# Patient Record
Sex: Female | Born: 1955 | Race: White | Hispanic: No | Marital: Married | Smoking: Former smoker
Health system: Southern US, Community
[De-identification: ages and names within clinical notes are randomized; demographics above are authoritative.]

## PROBLEM LIST (undated history)

## (undated) DIAGNOSIS — R87619 Unspecified abnormal cytological findings in specimens from cervix uteri: Secondary | ICD-10-CM

## (undated) DIAGNOSIS — B009 Herpesviral infection, unspecified: Secondary | ICD-10-CM

## (undated) DIAGNOSIS — Z780 Asymptomatic menopausal state: Secondary | ICD-10-CM

## (undated) DIAGNOSIS — IMO0002 Reserved for concepts with insufficient information to code with codable children: Secondary | ICD-10-CM

## (undated) HISTORY — DX: Reserved for concepts with insufficient information to code with codable children: IMO0002

## (undated) HISTORY — DX: Herpesviral infection, unspecified: B00.9

## (undated) HISTORY — DX: Asymptomatic menopausal state: Z78.0

## (undated) HISTORY — PX: BASAL CELL CARCINOMA EXCISION: SHX1214

## (undated) HISTORY — DX: Unspecified abnormal cytological findings in specimens from cervix uteri: R87.619

---
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
## 1987-12-19 HISTORY — PX: TUBAL LIGATION: SHX77

## 1999-03-09 ENCOUNTER — Other Ambulatory Visit: Admission: RE | Admit: 1999-03-09 | Discharge: 1999-03-09 | Payer: Self-pay | Admitting: *Deleted

## 1999-03-21 ENCOUNTER — Ambulatory Visit (HOSPITAL_COMMUNITY): Admission: RE | Admit: 1999-03-21 | Discharge: 1999-03-21 | Payer: Self-pay | Admitting: *Deleted

## 1999-03-25 ENCOUNTER — Ambulatory Visit (HOSPITAL_COMMUNITY): Admission: RE | Admit: 1999-03-25 | Discharge: 1999-03-25 | Payer: Self-pay | Admitting: *Deleted

## 2000-02-20 ENCOUNTER — Other Ambulatory Visit: Admission: RE | Admit: 2000-02-20 | Discharge: 2000-02-20 | Payer: Self-pay | Admitting: *Deleted

## 2002-06-16 ENCOUNTER — Other Ambulatory Visit: Admission: RE | Admit: 2002-06-16 | Discharge: 2002-06-16 | Payer: Self-pay | Admitting: Family Medicine

## 2002-06-16 ENCOUNTER — Encounter: Admission: RE | Admit: 2002-06-16 | Discharge: 2002-06-16 | Payer: Self-pay | Admitting: Geriatric Medicine

## 2002-06-16 ENCOUNTER — Encounter: Payer: Self-pay | Admitting: Family Medicine

## 2002-06-16 ENCOUNTER — Encounter: Admission: RE | Admit: 2002-06-16 | Discharge: 2002-06-16 | Payer: Self-pay | Admitting: Family Medicine

## 2002-12-01 ENCOUNTER — Other Ambulatory Visit: Admission: RE | Admit: 2002-12-01 | Discharge: 2002-12-01 | Payer: Self-pay | Admitting: Gynecology

## 2003-05-22 ENCOUNTER — Other Ambulatory Visit: Admission: RE | Admit: 2003-05-22 | Discharge: 2003-05-22 | Payer: Self-pay | Admitting: Gynecology

## 2004-12-18 HISTORY — PX: LUMBAR FUSION: SHX111

## 2005-06-16 ENCOUNTER — Ambulatory Visit (HOSPITAL_COMMUNITY): Admission: RE | Admit: 2005-06-16 | Discharge: 2005-06-17 | Payer: Self-pay | Admitting: Neurosurgery

## 2009-06-07 ENCOUNTER — Other Ambulatory Visit: Admission: RE | Admit: 2009-06-07 | Discharge: 2009-06-07 | Payer: Self-pay | Admitting: Family Medicine

## 2010-07-06 ENCOUNTER — Other Ambulatory Visit: Admission: RE | Admit: 2010-07-06 | Discharge: 2010-07-06 | Payer: Self-pay | Admitting: Family Medicine

## 2010-07-11 ENCOUNTER — Encounter: Admission: RE | Admit: 2010-07-11 | Discharge: 2010-07-11 | Payer: Self-pay | Admitting: Family Medicine

## 2011-01-08 ENCOUNTER — Encounter: Payer: Self-pay | Admitting: Chiropractic Medicine

## 2011-05-05 NOTE — Op Note (Signed)
Sonia Adams, Sonia Adams               ACCOUNT NO.:  [id number]   MEDICAL RECORD NO.:  [id number]          PATIENT TYPE:  OIB   LOCATION:  2899                         FACILITY:  [facility]   PHYSICIAN:  Payton Doughty, M.D.      DATE OF BIRTH:  05/09/1956   DATE OF PROCEDURE:  06/16/2005  DATE OF DISCHARGE:                                 OPERATIVE REPORT   PREOPERATIVE DIAGNOSIS:  Herniated disk at C6-7 on the right.   POSTOPERATIVE DIAGNOSIS:  Herniated disk at C6-7 on the right.   OPERATION:  C6-7 anterior cervical decompression and fusion with reflex  hybrid plate.   SURGEON:  Payton Doughty, M.D.   SERVICE:  Neurosurgery.   ANESTHESIA:  General endotracheal.   PREPARATION:  ____________ alcohol wipe.   COMPLICATIONS:  None.   NURSE ASSISTANT:  Covington.   DOCTOR ASSISTANT:  Jenkins.   DESCRIPTION OF PROCEDURE:  A 55 year old right handed white lady with a  right C7 radiculopathy secondary to herniated disk at C6-7. Taking to the  operating room after general anesthesia, intubated, and placed supine on the  operating table with the neck extended in the halter head traction.  Following shave, prepped and draped in the usual sterile fashion. The skin  was incised in the midline in the medial border of the sternocleidomastoid  on the left side. The platysma was identified and elevated, divided and  undermined. The sternocleidomastoid was identified and medial dissection  revealed the carotid artery, retracted and elevated to the left.  The  trachea and esophagus retracted laterally to the right sparing the recurrent  laryngeal nerves. Markers placed, intraoperative x-ray obtained to confirm  correct level and C6-7 disk removed under gross observation. The operating  microscope was then brought in, we used microdissection technique to remove  the posterior annulus, remove the herniated disk that extended down in the  right C7 neuroforamen. Decompressed the foramen bilaterally and  removed the  posterior longitudinal ligament. Following complete decompression, the 8 mm  bone graft was fashioned with patellar allograft and tapped into placed. A  14 mm reflex hybrid plate with 12 mm screws was then placed in C6 and C7.  Intraoperative x-rays show good placement of bone graft, plate and screws.  The wound was irrigated and hemostasis assured. The platysma and  subcutaneous tissue was reapproximated with 3-0 Vicryl in interrupted  fashion, skin was closed with 4-0 Vicryl in running subcuticular fashion.  Benzoin and Steri-Strips were placed ______________ op site and the patient  returned to the recovery room after being placed in an Aspen collar.       MWR/MEDQ  D:  06/16/2005  T:  06/16/2005  Job:  [id number]

## 2011-05-05 NOTE — H&P (Signed)
Sonia Adams               ACCOUNT NO.:  [id number]   MEDICAL RECORD NO.:  [id number]          PATIENT TYPE:  OIB   LOCATION:  NA                           FACILITY:  [facility]   PHYSICIAN:  Payton Doughty, M.D.      DATE OF BIRTH:  1956/11/24   DATE OF ADMISSION:  06/16/2005  DATE OF DISCHARGE:                                HISTORY & PHYSICAL   ADMISSION DIAGNOSIS:  C6-7 herniated disk.   BODY OF TEXT:  A very nice 55 year old right-handed white lady who in March  developed pain in her neck and shoulder.  It went away, but burning  sensation down the index and middle finger of the right hand.  She visited  with Dr. Jeral Fruit and was found to have a weak triceps.  Came and visited with  me, asked me to operate on her, and she is admitted now for an anterior  decompression and fusion.   MEDICAL HISTORY:  Unremarkable.   SOCIAL HISTORY:  Tubal ligation and tonsillectomy.   ALLERGIES:  None.   MEDICATIONS:  Potassium, Megace and B complex vitamins.   SOCIAL HISTORY:  Does not smoke, social drinker.  Is a Consulting civil engineer at [employer].   REVIEW OF SYSTEMS:  Remarkable for neck pain, arm pain and shoulder pain.   PHYSICAL EXAMINATION:  HEENT:  Within normal limits.  NECK:  She has reasonable range of motion in her neck.  CHEST:  Clear.  CARDIAC:  Regular rate and rhythm.  ABDOMEN:  Nontender with no hepatosplenomegaly.  EXTREMITIES:  Without clubbing or cyanosis.  GENITOURINARY:  Deferred.  VASCULAR:  Peripheral pulses are good.  NEUROLOGIC:  She is awake, alert and oriented.  Her cranial nerves are  intact.  Motor exam demonstrates 5/5 strength throughout the upper  extremities save for the right triceps, which is 1/5 with some atrophy.  Sensory exam:  There is a deficit in her right C7 distribution.  Deep tendon  reflexes are absent about the right triceps, otherwise intact.   MR demonstrates a large herniated disk at C6-7 with compression of the right  C7 root.   IMPRESSION:  Right C7  radiculopathy related to a herniated disk.   PLAN:  Anterior decompression and fusion at C6-7.  The risks and benefits of  this approach have been discussed, and she wishes to proceed.       MWR/MEDQ  D:  06/16/2005  T:  06/16/2005  Job:  [id number]

## 2012-06-12 ENCOUNTER — Other Ambulatory Visit: Payer: Self-pay | Admitting: Family Medicine

## 2012-06-12 ENCOUNTER — Other Ambulatory Visit (HOSPITAL_COMMUNITY)
Admission: RE | Admit: 2012-06-12 | Discharge: 2012-06-12 | Disposition: A | Discharge status: Home / Self Care | Payer: BC Managed Care – PPO | Source: Ambulatory Visit | Attending: Family Medicine | Admitting: Family Medicine

## 2012-06-12 DIAGNOSIS — Z124 Encounter for screening for malignant neoplasm of cervix: Secondary | ICD-10-CM | POA: Insufficient documentation

## 2012-07-31 ENCOUNTER — Encounter: Payer: Self-pay | Admitting: Internal Medicine

## 2012-07-31 ENCOUNTER — Ambulatory Visit (INDEPENDENT_AMBULATORY_CARE_PROVIDER_SITE_OTHER): Payer: BC Managed Care – PPO | Admitting: Internal Medicine

## 2012-07-31 VITALS — BP 100/70 | HR 76 | Temp 99.3°F | Resp 16 | Ht 65.75 in | Wt 164.0 lb

## 2012-07-31 DIAGNOSIS — Z87898 Personal history of other specified conditions: Secondary | ICD-10-CM

## 2012-07-31 DIAGNOSIS — IMO0002 Reserved for concepts with insufficient information to code with codable children: Secondary | ICD-10-CM

## 2012-07-31 DIAGNOSIS — Z8619 Personal history of other infectious and parasitic diseases: Secondary | ICD-10-CM

## 2012-07-31 DIAGNOSIS — N951 Menopausal and female climacteric states: Secondary | ICD-10-CM

## 2012-07-31 DIAGNOSIS — J309 Allergic rhinitis, unspecified: Secondary | ICD-10-CM

## 2012-07-31 DIAGNOSIS — E785 Hyperlipidemia, unspecified: Secondary | ICD-10-CM

## 2012-07-31 DIAGNOSIS — Z8742 Personal history of other diseases of the female genital tract: Secondary | ICD-10-CM

## 2012-08-01 ENCOUNTER — Encounter: Payer: Self-pay | Admitting: Internal Medicine

## 2012-08-01 DIAGNOSIS — N951 Menopausal and female climacteric states: Secondary | ICD-10-CM | POA: Insufficient documentation

## 2012-08-01 DIAGNOSIS — R87619 Unspecified abnormal cytological findings in specimens from cervix uteri: Secondary | ICD-10-CM | POA: Insufficient documentation

## 2012-08-01 DIAGNOSIS — E785 Hyperlipidemia, unspecified: Secondary | ICD-10-CM | POA: Insufficient documentation

## 2012-08-01 DIAGNOSIS — IMO0002 Reserved for concepts with insufficient information to code with codable children: Secondary | ICD-10-CM | POA: Insufficient documentation

## 2012-08-01 DIAGNOSIS — J309 Allergic rhinitis, unspecified: Secondary | ICD-10-CM | POA: Insufficient documentation

## 2012-08-01 DIAGNOSIS — Z8619 Personal history of other infectious and parasitic diseases: Secondary | ICD-10-CM | POA: Insufficient documentation

## 2012-08-01 NOTE — Progress Notes (Signed)
Subjective:    Patient ID: Sonia Adams, female    DOB: May 08, 1956, 56 y.o.   MRN: [id number]  HPI Sonia Adams is here as a new pt . For opinion regarding HT.  Primary care Dr. Zachery Dauer who she wishes to keep.  PMH of allergic rhinitis, remote abnormal pap in 1980's S/P cryosurgery, minimal hyperlipidemia, and remote genital herpes in1979.   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  She report she cannot sleep due to hot flushes and will have them frequently in the morning as well.  Also has vaginal dryness that causes painful intercourse .  She has used multiple OTC lubricants/moisturizers but they did not help.   Her LMP was 06/2010.  Her biologic half sister had breast cancer, no personal or Fh of DVT, PE other gyn cancers.  Father had MI age 72 and a stroke.  Her half sister also had MI and CVA.  Pt does not smoke and quite in 2008  She is due for her mammogram.   She brings copies of labs from [facility] and her [facility] was 122.3  Total cholesterol 220  HDL 57  LDL 148  Chemistries normal    Allergies  Allergen Reactions  . Levaquin (Levofloxacin In D5w) Itching and Other (See Comments)    shakiness   Past Medical History  Diagnosis Date  . Abnormal pap   . HSV-2 infection   . Menopause    Past Surgical History  Procedure Date  . Tonsillectomy 1962  . Tubal ligation 1989  . Lumbar fusion 2006   History   Social History  . Marital Status: Married    Spouse Name: N/A    Number of Children: N/A  . Years of Education: N/A   Occupational History  . Not on file.   Social History Main Topics  . Smoking status: Former Smoker -- 1.0 packs/day for 30 years    Types: Cigarettes    Quit date: 06/01/2007  . Smokeless tobacco: Never Used  . Alcohol Use: Yes     once a month  . Drug Use: No  . Sexually Active: Yes -- Female partner(s)   Other Topics Concern  . Not on file   Social History Narrative  . No narrative on file   Family History    Problem Relation Age of Onset  . Cancer Sister     breast  . Diabetes Mother   . Stroke Father   . Heart disease Father   . Alzheimer's disease Mother    Patient Active Problem List  Diagnosis  . Allergic rhinitis  . Dyspareunia  . Symptoms, such as flushing, sleeplessness, headache, lack of concentration, associated with the menopause  . Hyperlipidemia  . History of herpes genitalis  . History of abnormal Pap smear   Current Outpatient Prescriptions on File Prior to Visit  Medication Sig Dispense Refill  . calcium-vitamin D (OSCAL WITH D) 500-200 MG-UNIT per tablet Take 1 tablet by mouth daily.            Review of Systems See HPI    Objective:   Physical Exam  Physical Exam  Nursing note and vitals reviewed.  Constitutional: She is oriented to person, place, and time. She appears well-developed and well-nourished.  HENT:  Head: Normocephalic and atraumatic.  Cardiovascular: Normal rate and regular rhythm. Exam reveals no gallop and no friction rub.  No murmur heard.  Pulmonary/Chest: Breath sounds normal. She has no wheezes. She has  no rales.  Neurological: She is alert and oriented to person, place, and time.  Skin: Skin is warm and dry.  Psychiatric: She has a normal mood and affect. Her behavior is normal.  Ext no edema           Assessment & Plan:  Symptomatic menopause:  Long counseling regarding risk/benefit of HT including breast cancer, MI, CVA, DVT risk.  See signed risk/benefit sheet.  She is less than 10 years from LMP.  Pt desires to try HT for a short course and I am OK with this for 1-2 years.  Will need mammogram first.  Mild hyperlipidemia  monitered with primary care MD  Allergic rhinitis  FH of breast CA in biologic half sister.  Counseled to make sure she has mammogram every year.

## 2012-08-05 ENCOUNTER — Telehealth: Payer: Self-pay | Admitting: Internal Medicine

## 2012-08-05 NOTE — Telephone Encounter (Signed)
Spoke with pt.  Her mammogram at [facility] shows no significant abnormality.  I counseled pt that I will initiate HT but she must take the BRCA test with FH of breast ca in her half sister.   Will have Lora call pt to discuss  Mervyn Gay call pt about BRCA testing and have her come in for BRCA testing

## 2012-08-06 ENCOUNTER — Telehealth: Payer: Self-pay | Admitting: Internal Medicine

## 2012-08-06 MED ORDER — ESTRADIOL 0.05 MG/24HR TD PTTW: MEDICATED_PATCH | TRANSDERMAL | Status: DC

## 2012-08-06 MED ORDER — PROGESTERONE MICRONIZED 100 MG PO CAPS: 100.0000 mg | ORAL_CAPSULE | Freq: Every day | ORAL | Status: DC

## 2012-08-06 NOTE — Telephone Encounter (Signed)
LM on cell phone that due to her family history she is encouraged to have the [facility] drawn.  Told pt that she can call office for a nurse visit @ her convenience.  Also mailed pt a coupon for [employer].

## 2012-08-06 NOTE — Telephone Encounter (Signed)
Sonia Adams  Let pt know I have a minivelle coupon for her.

## 2012-08-07 ENCOUNTER — Telehealth: Payer: Self-pay | Admitting: Internal Medicine

## 2012-08-07 NOTE — Telephone Encounter (Signed)
Pt was calling to say she had a genetic lab test already.. It test her for [facility] AND [facility] (may have spelled those wrong).. Pt does not want to go thru the expense of getting this test again.Marland Kitchen She will have her results in the next three weeks... She states she can have them fax over to Dr. Constance Goltz... Please call pt at [phone]... Thanks   The message is still on the voicemail, if one needs to hear it.. Ad

## 2012-08-07 NOTE — Telephone Encounter (Signed)
LM on voice mail to clarify of whether the genetic testing was [facility].  She stated that she had had the test done and will fax the results over in about 3 weeks when she receives the results.

## 2012-08-12 ENCOUNTER — Telehealth: Payer: Self-pay | Admitting: Internal Medicine

## 2012-08-12 NOTE — Telephone Encounter (Signed)
Pt called and states she recd the coupon for the minivelle, but when she went to the pharmacy they states she was prescribe vi velle dot.Marland KitchenMarland Kitchen

## 2012-08-12 NOTE — Telephone Encounter (Signed)
i thought they would honor this???

## 2012-08-13 MED ORDER — ESTRADIOL 0.05 MG/24HR TD PTTW: MEDICATED_PATCH | TRANSDERMAL | Status: DC

## 2012-08-16 ENCOUNTER — Encounter: Payer: Self-pay | Admitting: *Deleted

## 2012-08-20 ENCOUNTER — Encounter: Payer: Self-pay | Admitting: *Deleted

## 2012-08-30 ENCOUNTER — Encounter: Payer: Self-pay | Admitting: *Deleted

## 2012-08-30 ENCOUNTER — Encounter: Payer: Self-pay | Admitting: Internal Medicine

## 2012-09-03 ENCOUNTER — Encounter: Payer: Self-pay | Admitting: *Deleted

## 2012-09-18 ENCOUNTER — Encounter: Payer: Self-pay | Admitting: *Deleted

## 2012-09-23 ENCOUNTER — Encounter: Payer: Self-pay | Admitting: *Deleted

## 2012-10-01 ENCOUNTER — Telehealth: Payer: Self-pay | Admitting: *Deleted

## 2012-10-01 NOTE — Telephone Encounter (Signed)
Sonia Adams  Call pt and let her know that I have not received BRCA results.  Did she get the results?  Route back to me with her response

## 2012-10-01 NOTE — Telephone Encounter (Signed)
Left message to call with results of [facility]ACA testing awaiting return call

## 2012-10-01 NOTE — Telephone Encounter (Signed)
Called pt regarding results of [facility]ACA testing left message awaiting return call

## 2012-10-04 ENCOUNTER — Encounter: Payer: Self-pay | Admitting: *Deleted

## 2012-10-28 ENCOUNTER — Ambulatory Visit (INDEPENDENT_AMBULATORY_CARE_PROVIDER_SITE_OTHER): Payer: BC Managed Care – PPO | Admitting: Internal Medicine

## 2012-10-28 ENCOUNTER — Encounter: Payer: Self-pay | Admitting: Internal Medicine

## 2012-10-28 ENCOUNTER — Other Ambulatory Visit: Payer: Self-pay | Admitting: Internal Medicine

## 2012-10-28 VITALS — BP 112/70 | HR 64 | Temp 98.2°F | Resp 18 | Wt 165.0 lb

## 2012-10-28 DIAGNOSIS — N951 Menopausal and female climacteric states: Secondary | ICD-10-CM

## 2012-10-28 DIAGNOSIS — Z23 Encounter for immunization: Secondary | ICD-10-CM

## 2012-10-28 DIAGNOSIS — IMO0002 Reserved for concepts with insufficient information to code with codable children: Secondary | ICD-10-CM

## 2012-10-28 DIAGNOSIS — Z87898 Personal history of other specified conditions: Secondary | ICD-10-CM

## 2012-10-28 DIAGNOSIS — Z8742 Personal history of other diseases of the female genital tract: Secondary | ICD-10-CM

## 2012-10-28 MED ORDER — PROGESTERONE MICRONIZED 100 MG PO CAPS: 100.0000 mg | ORAL_CAPSULE | Freq: Every day | ORAL | Status: DC

## 2012-10-28 MED ORDER — ESTRADIOL 0.05 MG/24HR TD PTTW: MEDICATED_PATCH | TRANSDERMAL | Status: DC

## 2012-10-28 NOTE — Patient Instructions (Addendum)
See me as needed  Flu shot today

## 2012-10-28 NOTE — Progress Notes (Signed)
Subjective:    Patient ID: Sonia Adams, female    DOB: 1956-04-28, 56 y.o.   MRN: [id number]  HPI Sonia Adams is here for follow up  States her hot flushes and dyspareunia is markedly improved.  She still uses KY jelly occasionally.    She brings copies of her BRCA from website [facility] which reports no cancer mutations according to their analysis.        Allergies  Allergen Reactions  . Levaquin (Levofloxacin In D5w) Itching and Other (See Comments)    shakiness   Past Medical History  Diagnosis Date  . Abnormal pap   . HSV-2 infection   . Menopause    Past Surgical History  Procedure Date  . Tonsillectomy 1962  . Tubal ligation 1989  . Lumbar fusion 2006   History   Social History  . Marital Status: Married    Spouse Name: N/A    Number of Children: N/A  . Years of Education: N/A   Occupational History  . Not on file.   Social History Main Topics  . Smoking status: Former Smoker -- 1.0 packs/day for 30 years    Types: Cigarettes    Quit date: 06/01/2007  . Smokeless tobacco: Never Used  . Alcohol Use: Yes     Comment: once a month  . Drug Use: No  . Sexually Active: Yes -- Female partner(s)   Other Topics Concern  . Not on file   Social History Narrative  . No narrative on file   Family History  Problem Relation Age of Onset  . Cancer Sister     breast  . Diabetes Mother   . Stroke Father   . Heart disease Father   . Alzheimer's disease Mother    Patient Active Problem List  Diagnosis  . Allergic rhinitis  . Dyspareunia  . Symptoms, such as flushing, sleeplessness, headache, lack of concentration, associated with the menopause  . Hyperlipidemia  . History of herpes genitalis  . History of abnormal Pap smear   Current Outpatient Prescriptions on File Prior to Visit  Medication Sig Dispense Refill  . calcium-vitamin D (OSCAL WITH D) 500-200 MG-UNIT per tablet Take 1 tablet by mouth daily.      Marland Kitchen estradiol (MINIVELLE) 0.05 MG/24HR Apply to  skin.  Change twice a week  8 patch  2  . Multiple Vitamin (MULTIVITAMIN) tablet Take 1 tablet by mouth daily.      . progesterone (PROMETRIUM) 100 MG capsule Take 1 capsule (100 mg total) by mouth daily.  30 capsule  2       Review of Systems See HPI    Objective:   Physical Exam Physical Exam  Nursing note and vitals reviewed. Bp  My exam good Constitutional: She is oriented to person, place, and time. She appears well-developed and well-nourished.  HENT:  Head: Normocephalic and atraumatic.  Cardiovascular: Normal rate and regular rhythm. Exam reveals no gallop and no friction rub.  No murmur heard.  Pulmonary/Chest: Breath sounds normal. She has no wheezes. She has no rales.  Neurological: She is alert and oriented to person, place, and time.  Skin: Skin is warm and dry.  Psychiatric: She has a normal mood and affect. Her behavior is normal.  Ext no edema            Assessment & Plan:  Menopausal hot flushes  Improved .  Counseled again if any LE edema or vaginal spotting to call office Refill ordered  Dyspareunia  Improved.  Luvena samples given  Hyperlipidemia  mild

## 2012-10-28 NOTE — Addendum Note (Signed)
Addended by: Mathews Robinsons on: 10/28/2012 10:46 AM   Modules accepted: Orders

## 2012-11-04 ENCOUNTER — Other Ambulatory Visit: Payer: Self-pay | Admitting: *Deleted

## 2012-11-08 ENCOUNTER — Encounter: Payer: Self-pay | Admitting: *Deleted

## 2012-11-11 ENCOUNTER — Other Ambulatory Visit: Payer: Self-pay | Admitting: *Deleted

## 2012-11-13 ENCOUNTER — Other Ambulatory Visit: Payer: Self-pay | Admitting: *Deleted

## 2012-11-13 NOTE — Telephone Encounter (Signed)
We have sent this in still getting request I called rx in today

## 2013-01-06 ENCOUNTER — Encounter: Payer: Self-pay | Admitting: Internal Medicine

## 2013-01-06 ENCOUNTER — Ambulatory Visit (INDEPENDENT_AMBULATORY_CARE_PROVIDER_SITE_OTHER): Payer: BC Managed Care – PPO | Admitting: Internal Medicine

## 2013-01-06 VITALS — BP 95/65 | HR 62 | Temp 97.3°F | Resp 20 | Wt 166.0 lb

## 2013-01-06 DIAGNOSIS — J45909 Unspecified asthma, uncomplicated: Secondary | ICD-10-CM

## 2013-01-06 DIAGNOSIS — R059 Cough, unspecified: Secondary | ICD-10-CM

## 2013-01-06 DIAGNOSIS — R05 Cough: Secondary | ICD-10-CM

## 2013-01-06 DIAGNOSIS — J9801 Acute bronchospasm: Secondary | ICD-10-CM

## 2013-01-06 MED ORDER — CEFTRIAXONE SODIUM 1 G IJ SOLR
1.0000 g | INTRAMUSCULAR | Status: DC
  Administered 2013-01-06: 1 g via INTRAMUSCULAR

## 2013-01-06 MED ORDER — ALBUTEROL SULFATE HFA 108 (90 BASE) MCG/ACT IN AERS: INHALATION_SPRAY | RESPIRATORY_TRACT | Status: DC

## 2013-01-06 MED ORDER — AZITHROMYCIN 250 MG PO TABS: ORAL_TABLET | ORAL | Status: DC

## 2013-01-06 MED ORDER — METHYLPREDNISOLONE ACETATE 80 MG/ML IJ SUSP
120.0000 mg | Freq: Once | INTRAMUSCULAR | Status: AC
  Administered 2013-01-06: 120 mg via INTRAMUSCULAR

## 2013-01-06 NOTE — Progress Notes (Signed)
Subjective:    Patient ID: Sonia Adams, female    DOB: September 09, 1956, 57 y.o.   MRN: [id number]  HPI  Sonia Adams is here for acute visit.  She has  Been dealing with URI symptoms on and off since last of December.  No fever no chest pain.  Cough occasionally productive of clear sputum.  Feels tight in chest  She is a former smoker and tells me she quit 5 years ago  Allergies  Allergen Reactions  . Levaquin (Levofloxacin In D5w) Itching and Other (See Comments)    shakiness   Past Medical History  Diagnosis Date  . Abnormal pap   . HSV-2 infection   . Menopause    Past Surgical History  Procedure Date  . Tonsillectomy 1962  . Tubal ligation 1989  . Lumbar fusion 2006   History   Social History  . Marital Status: Married    Spouse Name: N/A    Number of Children: N/A  . Years of Education: N/A   Occupational History  . Not on file.   Social History Main Topics  . Smoking status: Former Smoker -- 1.0 packs/day for 30 years    Types: Cigarettes    Quit date: 06/01/2007  . Smokeless tobacco: Never Used  . Alcohol Use: Yes     Comment: once a month  . Drug Use: No  . Sexually Active: Yes -- Female partner(s)   Other Topics Concern  . Not on file   Social History Narrative  . No narrative on file   Family History  Problem Relation Age of Onset  . Cancer Sister     breast  . Diabetes Mother   . Stroke Father   . Heart disease Father   . Alzheimer's disease Mother    Patient Active Problem List  Diagnosis  . Allergic rhinitis  . Dyspareunia  . Symptoms, such as flushing, sleeplessness, headache, lack of concentration, associated with the menopause  . Hyperlipidemia  . History of herpes genitalis  . History of abnormal Pap smear   Current Outpatient Prescriptions on File Prior to Visit  Medication Sig Dispense Refill  . calcium-vitamin D (OSCAL WITH D) 500-200 MG-UNIT per tablet Take 1 tablet by mouth daily.      Marland Kitchen estradiol (MINIVELLE) 0.05 MG/24HR  Apply to skin.  Change twice a week  8 patch  6  . Multiple Vitamin (MULTIVITAMIN) tablet Take 1 tablet by mouth daily.      . progesterone (PROMETRIUM) 100 MG capsule Take 1 capsule (100 mg total) by mouth daily.  30 capsule  5      Review of Systems See HPI    Objective:   Physical Exam  Physical Exam  Nursing note and vitals reviewed.   Peak flow 260 Constitutional: She is oriented to person, place, and time. She appears well-developed and well-nourished. She is cooperative.  HENT:  Head: Normocephalic and atraumatic.  Nose: Mucosal edema present.  Eyes: Conjunctivae and EOM are normal. Pupils are equal, round, and reactive to light.  Neck: Neck supple.  Cardiovascular: Regular rhythm, normal heart sounds, intact distal pulses and normal pulses. Exam reveals no gallop and no friction rub.  No murmur heard.  Pulmonary/Chest: She has bilateral end exp wheezing. She has rhonchi. She has no rales.  Neurological: She is alert and oriented to person, place, and time.  Skin: Skin is warm and dry. No abrasion, no bruising, no ecchymosis and no rash noted. No cyanosis. Nails show no clubbing.  Psychiatric: She has a normal mood and affect. Her speech is normal and behavior is normal.           Assessment & Plan:  Bronchospasm  Will give depomedrol  120 mg Im in office  Albuterol MDI  2 puffs tid  Bronchitis  Rocephin  1 gm today with Zpak RX  Cough  She does not want RX meds  Ok to try Delsym OTC  She is to follow up with me Wednesday

## 2013-01-06 NOTE — Patient Instructions (Addendum)
See me in 48 hours 

## 2013-01-08 ENCOUNTER — Encounter: Payer: Self-pay | Admitting: Internal Medicine

## 2013-01-08 ENCOUNTER — Ambulatory Visit (INDEPENDENT_AMBULATORY_CARE_PROVIDER_SITE_OTHER): Payer: BC Managed Care – PPO | Admitting: Internal Medicine

## 2013-01-08 VITALS — BP 98/55 | HR 64 | Temp 98.3°F | Resp 18 | Wt 169.0 lb

## 2013-01-08 DIAGNOSIS — R059 Cough, unspecified: Secondary | ICD-10-CM

## 2013-01-08 DIAGNOSIS — R05 Cough: Secondary | ICD-10-CM

## 2013-01-08 DIAGNOSIS — J45909 Unspecified asthma, uncomplicated: Secondary | ICD-10-CM

## 2013-01-08 DIAGNOSIS — J9801 Acute bronchospasm: Secondary | ICD-10-CM

## 2013-01-08 NOTE — Progress Notes (Signed)
Subjective:    Patient ID: PAYTAN RECINE, female    DOB: 01/18/1956, 57 y.o.   MRN: [id number]  HPI Malea is here for follow up.  She is feeling much better.  No night time symptoms.  Cough less and bringing up clear mucous.  Still on Zpak .  Using albuterol tid not requiring more use.  Chest feels less tight   Allergies  Allergen Reactions  . Levaquin (Levofloxacin In D5w) Itching and Other (See Comments)    shakiness   Past Medical History  Diagnosis Date  . Abnormal pap   . HSV-2 infection   . Menopause    Past Surgical History  Procedure Date  . Tonsillectomy 1962  . Tubal ligation 1989  . Lumbar fusion 2006   History   Social History  . Marital Status: Married    Spouse Name: N/A    Number of Children: N/A  . Years of Education: N/A   Occupational History  . Not on file.   Social History Main Topics  . Smoking status: Former Smoker -- 1.0 packs/day for 30 years    Types: Cigarettes    Quit date: 06/01/2007  . Smokeless tobacco: Never Used  . Alcohol Use: Yes     Comment: once a month  . Drug Use: No  . Sexually Active: Yes -- Female partner(s)   Other Topics Concern  . Not on file   Social History Narrative  . No narrative on file   Family History  Problem Relation Age of Onset  . Cancer Sister     breast  . Diabetes Mother   . Stroke Father   . Heart disease Father   . Alzheimer's disease Mother    Patient Active Problem List  Diagnosis  . Allergic rhinitis  . Dyspareunia  . Symptoms, such as flushing, sleeplessness, headache, lack of concentration, associated with the menopause  . Hyperlipidemia  . History of herpes genitalis  . History of abnormal Pap smear  . Asthmatic bronchitis   Current Outpatient Prescriptions on File Prior to Visit  Medication Sig Dispense Refill  . albuterol (PROVENTIL HFA;VENTOLIN HFA) 108 (90 BASE) MCG/ACT inhaler 2 inhalations 3 times a day  1 Inhaler  0  . calcium-vitamin D (OSCAL WITH D) 500-200  MG-UNIT per tablet Take 1 tablet by mouth daily.      Marland Kitchen estradiol (MINIVELLE) 0.05 MG/24HR Apply to skin.  Change twice a week  8 patch  6  . Multiple Vitamin (MULTIVITAMIN) tablet Take 1 tablet by mouth daily.      . progesterone (PROMETRIUM) 100 MG capsule Take 1 capsule (100 mg total) by mouth daily.  30 capsule  5  . azithromycin (ZITHROMAX) 250 MG tablet Take as directed  6 tablet  0       Review of Systems    see HPI Objective:   Physical Exam Physical Exam  Nursing note and vitals reviewed.   Peak flow today 500 Constitutional: She is oriented to person, place, and time. She appears well-developed and well-nourished. She is cooperative.  HENT:  Head: Normocephalic and atraumatic.  Nose: Mucosal edema present.  Eyes: Conjunctivae and EOM are normal. Pupils are equal, round, and reactive to light.  Neck: Neck supple.  Cardiovascular: Regular rhythm, normal heart sounds, intact distal pulses and normal pulses. Exam reveals no gallop and no friction rub.  No murmur heard.  Pulmonary/Chest: She has no wheezes. She has rhonchi. She has no rales.  Moving air much better  Neurological:  She is alert and oriented to person, place, and time.  Skin: Skin is warm and dry. No abrasion, no bruising, no ecchymosis and no rash noted. No cyanosis. Nails show no clubbing.  Psychiatric: She has a normal mood and affect. Her speech is normal and behavior is normal.       Assessment & Plan:  Asthmatic bronchitis  Improving  Continue  Albuterol 3 more days then can stop.  Continue antibiotic  Cough improving

## 2013-01-08 NOTE — Patient Instructions (Addendum)
See me as needed 

## 2013-04-17 ENCOUNTER — Telehealth: Payer: Self-pay | Admitting: *Deleted

## 2013-04-17 ENCOUNTER — Ambulatory Visit: Payer: BC Managed Care – PPO | Admitting: Internal Medicine

## 2013-04-17 ENCOUNTER — Ambulatory Visit (INDEPENDENT_AMBULATORY_CARE_PROVIDER_SITE_OTHER): Payer: BC Managed Care – PPO | Admitting: Internal Medicine

## 2013-04-17 ENCOUNTER — Ambulatory Visit (HOSPITAL_BASED_OUTPATIENT_CLINIC_OR_DEPARTMENT_OTHER)
Admission: RE | Admit: 2013-04-17 | Discharge: 2013-04-17 | Disposition: A | Discharge status: Home / Self Care | Payer: BC Managed Care – PPO | Source: Ambulatory Visit | Attending: Internal Medicine | Admitting: Internal Medicine

## 2013-04-17 ENCOUNTER — Encounter: Payer: Self-pay | Admitting: Internal Medicine

## 2013-04-17 VITALS — BP 94/52 | HR 75 | Temp 97.5°F | Resp 18 | Wt 164.0 lb

## 2013-04-17 DIAGNOSIS — R059 Cough, unspecified: Secondary | ICD-10-CM

## 2013-04-17 DIAGNOSIS — R05 Cough: Secondary | ICD-10-CM

## 2013-04-17 DIAGNOSIS — R509 Fever, unspecified: Secondary | ICD-10-CM

## 2013-04-17 DIAGNOSIS — J329 Chronic sinusitis, unspecified: Secondary | ICD-10-CM

## 2013-04-17 DIAGNOSIS — J3489 Other specified disorders of nose and nasal sinuses: Secondary | ICD-10-CM | POA: Insufficient documentation

## 2013-04-17 MED ORDER — CEFTRIAXONE SODIUM 1 G IJ SOLR
1.0000 g | Freq: Once | INTRAMUSCULAR | Status: AC
  Administered 2013-04-17: 1 g via INTRAMUSCULAR

## 2013-04-17 MED ORDER — AMOXICILLIN-POT CLAVULANATE 500-125 MG PO TABS: ORAL_TABLET | ORAL | Status: DC

## 2013-04-17 MED ORDER — HYDROCOD POLST-CHLORPHEN POLST 10-8 MG/5ML PO LQCR: ORAL | Status: DC

## 2013-04-17 NOTE — Patient Instructions (Addendum)
See me in 7-10 days  

## 2013-04-17 NOTE — Progress Notes (Signed)
Subjective:    Patient ID: Sonia Adams, female    DOB: 01-30-1956, 57 y.o.   MRN: [id number]  HPI  Sonia Adams is here for acute visit.   Sonia Adams had fever to 101.6    2 weeks ago and now has cough productive of white mucous and yellow nasal discharge.  Both maxillary cheeks feel full and hurt when Sonia Adams chews  Fever is gone.   No SOB no chest pain  Allergies  Allergen Reactions  . Levaquin (Levofloxacin In D5w) Itching and Other (See Comments)    shakiness   Past Medical History  Diagnosis Date  . Abnormal pap   . HSV-2 infection   . Menopause    Past Surgical History  Procedure Laterality Date  . Tonsillectomy  1962  . Tubal ligation  1989  . Lumbar fusion  2006   History   Social History  . Marital Status: Married    Spouse Name: N/A    Number of Children: N/A  . Years of Education: N/A   Occupational History  . Not on file.   Social History Main Topics  . Smoking status: Former Smoker -- 1.00 packs/day for 30 years    Types: Cigarettes    Quit date: 06/01/2007  . Smokeless tobacco: Never Used  . Alcohol Use: Yes     Comment: once a month  . Drug Use: No  . Sexually Active: Yes -- Female partner(s)   Other Topics Concern  . Not on file   Social History Narrative  . No narrative on file   Family History  Problem Relation Age of Onset  . Cancer Sister     breast  . Diabetes Mother   . Stroke Father   . Heart disease Father   . Alzheimer's disease Mother    Patient Active Problem List   Diagnosis Date Noted  . Asthmatic bronchitis 01/06/2013  . Allergic rhinitis 08/01/2012  . Dyspareunia 08/01/2012  . Symptoms, such as flushing, sleeplessness, headache, lack of concentration, associated with the menopause 08/01/2012  . Hyperlipidemia 08/01/2012  . 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/01/2012  . History of abnormal Pap smear 08/01/2012   Current Outpatient Prescriptions on File Prior to Visit  Medication Sig Dispense Refill  . calcium-vitamin D (OSCAL  WITH D) 500-200 MG-UNIT per tablet Take 1 tablet by mouth daily.      Marland Kitchen estradiol (MINIVELLE) 0.05 MG/24HR Apply to skin.  Change twice a week  8 patch  6  . Multiple Vitamin (MULTIVITAMIN) tablet Take 1 tablet by mouth daily.      . progesterone (PROMETRIUM) 100 MG capsule Take 1 capsule (100 mg total) by mouth daily.  30 capsule  5  . albuterol (PROVENTIL HFA;VENTOLIN HFA) 108 (90 BASE) MCG/ACT inhaler 2 inhalations 3 times a day  1 Inhaler  0   No current facility-administered medications on file prior to visit.       Review of Systems See HPI    Objective:   Physical Exam Physical Exam  Constitutional: Sonia Adams is oriented to person, place, and time. Sonia Adams appears well-developed and well-nourished. Sonia Adams is cooperative.  HENT:  Head: Normocephalic and atraumatic.  Right Ear: A middle ear effusion is present.  Left Ear: A middle ear effusion is present.  Nose: Mucosal edema present. Right sinus exhibits maxillary sinus tenderness. Left sinus exhibits maxillary sinus tenderness.  Mouth/Throat: Posterior oropharyngeal erythema present.  Serous effusion bilaterally  Eyes: Conjunctivae and EOM are normal. Pupils are equal, round, and reactive to light.  Neck: Neck supple. Carotid bruit is not present. No mass present.  Cardiovascular: Regular rhythm, normal heart sounds, intact distal pulses and normal pulses. Exam reveals no gallop and no friction rub.  No murmur heard.  Pulmonary/Chest: Breath sounds normal. Sonia Adams has no wheezes. Few rhonchii at both bases. Sonia Adams has no rales.  Neurological: Sonia Adams is alert and oriented to person, place, and time.  Skin: Skin is warm and dry. No abrasion, no bruising, no ecchymosis and no rash noted. No cyanosis. Nails show no clubbing.  Psychiatric: Sonia Adams has a normal mood and affect. Her speech is normal and behavior is normal.        Assessment & Plan:  Sinusitis  Will give rocephin 1 gm in office  And Augmentin 500 bid  CXR today shows peribronchial  thickening.    Cough tussionex 1 tsp q12h    See me in 7-10 days

## 2013-04-17 NOTE — Telephone Encounter (Signed)
Pt advised to go to [facility] regarding URI sx pt with appt on 5/01 at 4:15

## 2013-04-30 ENCOUNTER — Ambulatory Visit: Payer: BC Managed Care – PPO | Admitting: Internal Medicine

## 2013-05-15 ENCOUNTER — Other Ambulatory Visit: Payer: Self-pay | Admitting: Internal Medicine

## 2013-05-15 NOTE — Telephone Encounter (Signed)
Pt called needs 2 prescriptions transferred from [facility] to [facility].  Prescriptions Minivelle patch & Progesterone faxed to [facility] pharmacy.  Karin Golden phone number [phone] and fax number [phone].  Pt call back phone number [phone].

## 2013-05-16 NOTE — Telephone Encounter (Signed)
Refill request changed pharmacy in chart per pt request

## 2013-05-19 MED ORDER — ESTRADIOL 0.05 MG/24HR TD PTTW: MEDICATED_PATCH | TRANSDERMAL | Status: DC

## 2013-05-19 MED ORDER — PROGESTERONE MICRONIZED 100 MG PO CAPS: 100.0000 mg | ORAL_CAPSULE | Freq: Every day | ORAL | Status: DC

## 2013-05-19 NOTE — Telephone Encounter (Signed)
[facility] [address]  Call [place] and let her know I gave her enough refills to last through August.  She is due for her mm in August and I will give her more refills when I have her mammogram report back in August

## 2013-06-24 ENCOUNTER — Ambulatory Visit (HOSPITAL_BASED_OUTPATIENT_CLINIC_OR_DEPARTMENT_OTHER)
Admission: RE | Admit: 2013-06-24 | Discharge: 2013-06-24 | Disposition: A | Discharge status: Home / Self Care | Payer: BC Managed Care – PPO | Source: Ambulatory Visit | Attending: Internal Medicine | Admitting: Internal Medicine

## 2013-06-24 ENCOUNTER — Ambulatory Visit (INDEPENDENT_AMBULATORY_CARE_PROVIDER_SITE_OTHER): Payer: BC Managed Care – PPO | Admitting: Internal Medicine

## 2013-06-24 ENCOUNTER — Encounter: Payer: Self-pay | Admitting: Internal Medicine

## 2013-06-24 VITALS — BP 100/60 | HR 61 | Temp 99.5°F | Resp 16 | Ht 67.0 in | Wt 163.0 lb

## 2013-06-24 DIAGNOSIS — R9431 Abnormal electrocardiogram [ECG] [EKG]: Secondary | ICD-10-CM

## 2013-06-24 DIAGNOSIS — Z0000 Encounter for general adult medical examination without abnormal findings: Secondary | ICD-10-CM

## 2013-06-24 DIAGNOSIS — E042 Nontoxic multinodular goiter: Secondary | ICD-10-CM | POA: Insufficient documentation

## 2013-06-24 DIAGNOSIS — E785 Hyperlipidemia, unspecified: Secondary | ICD-10-CM

## 2013-06-24 DIAGNOSIS — Z87891 Personal history of nicotine dependence: Secondary | ICD-10-CM

## 2013-06-24 DIAGNOSIS — N951 Menopausal and female climacteric states: Secondary | ICD-10-CM

## 2013-06-24 DIAGNOSIS — M5020 Other cervical disc displacement, unspecified cervical region: Secondary | ICD-10-CM

## 2013-06-24 DIAGNOSIS — Z803 Family history of malignant neoplasm of breast: Secondary | ICD-10-CM

## 2013-06-24 LAB — POCT URINALYSIS DIPSTICK
Bilirubin, UA: NEGATIVE
Blood, UA: NEGATIVE
Nitrite, UA: NEGATIVE
Protein, UA: NEGATIVE
Spec Grav, UA: 1.01
Urobilinogen, UA: NEGATIVE
pH, UA: 6.5

## 2013-06-24 LAB — COMPREHENSIVE METABOLIC PANEL
AST: 21 U/L (ref 0–37)
CO2: 29 mEq/L (ref 19–32)
Chloride: 103 mEq/L (ref 96–112)
Creat: 0.81 mg/dL (ref 0.50–1.10)
Glucose, Bld: 69 mg/dL — ABNORMAL LOW (ref 70–99)
Sodium: 137 mEq/L (ref 135–145)

## 2013-06-24 LAB — LIPID PANEL
HDL: 68 mg/dL (ref >39)
Total CHOL/HDL Ratio: 3.6 Ratio

## 2013-06-24 LAB — TSH: TSH: 1.039 u[IU]/mL (ref 0.350–4.500)

## 2013-06-24 NOTE — Patient Instructions (Addendum)
Will get thyroid ultrasound  Will refer to cardiology   Labs today

## 2013-06-24 NOTE — Progress Notes (Signed)
Subjective:    Patient ID: Sonia Adams, female    DOB: [birth year], 57 y.o.   MRN: [id number]  HPI Sonia Adams is here for CPE/  REcently returned from a trip to [place] with her sister.    UTD with mm, normal dexa 2011,  And normal pap 2013  Dr. Zachery Dauer.  She tells me she had remote cervical cryosurgery in 1980's Dr. Gaye Alken but has had normal paps for several years.   She also reports she has a history of MNG and was evaluatead with imaging years ago.  See report of 2000.  She had multiple hot nodules consistant with MNG.  She does report some dysphagia but she attributed that to her cervical fusion.  No history of XRT.  No HYPER/HYpothyroid symptoms.   She stopped smoking in 2008.  She has less than  30 pack year history.  Smoked "off and on"  Biologic half Sister with breast cancer .  Pt went ot My [employer] me website and was negative to analysis tested.  We have had long discussion of breast cancer risk in th epast and we agreeed to a short course of HT 1-2 years   No vaginal spotting or LE edema on HT  SEe EKG   Pt completely asymptomatic.  No past chest pain or pressure.  I have no old ekg for comparison  Allergies  Allergen Reactions  . Levaquin (Levofloxacin In D5w) Itching and Other (See Comments)    shakiness   Past Medical History  Diagnosis Date  . Abnormal pap   . HSV-2 infection   . Menopause    Past Surgical History  Procedure Laterality Date  . Tonsillectomy  1962  . Tubal ligation  1989  . Lumbar fusion  2006   History   Social History  . Marital Status: Married    Spouse Name: N/A    Number of Children: N/A  . Years of Education: N/A   Occupational History  . Not on file.   Social History Main Topics  . Smoking status: Former Smoker -- 1.00 packs/day for 30 years    Types: Cigarettes    Quit date: 06/01/2007  . Smokeless tobacco: Never Used  . Alcohol Use: Yes     Comment: once a month  . Drug Use: No  . Sexually Active: Yes -- Female partner(s)    Other Topics Concern  . Not on file   Social History Narrative  . No narrative on file   Family History  Problem Relation Age of Onset  . Cancer Sister     breast  . Diabetes Mother   . Stroke Father   . Heart disease Father   . Alzheimer's disease Mother    Patient Active Problem List   Diagnosis Date Noted  . Nonspecific abnormal electrocardiogram (ECG) (EKG) 06/24/2013  . Multinodular goiter 06/24/2013  . Herniated cervical disc 06/24/2013  . Asthmatic bronchitis 01/06/2013  . Allergic rhinitis 08/01/2012  . Dyspareunia 08/01/2012  . Symptoms, such as flushing, sleeplessness, headache, lack of concentration, associated with the menopause 08/01/2012  . Hyperlipidemia 08/01/2012  . 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/01/2012  . History of abnormal Pap smear 08/01/2012   Current Outpatient Prescriptions on File Prior to Visit  Medication Sig Dispense Refill  . calcium-vitamin D (OSCAL WITH D) 500-200 MG-UNIT per tablet Take 1 tablet by mouth daily.      Marland Kitchen estradiol (MINIVELLE) 0.05 MG/24HR Apply to skin.  Change twice a week  8 patch  2  .  Magnesium 500 MG TABS Take 1 tablet by mouth daily.      . Multiple Vitamin (MULTIVITAMIN) tablet Take 1 tablet by mouth daily.      . progesterone (PROMETRIUM) 100 MG capsule Take 1 capsule (100 mg total) by mouth daily.  30 capsule  2   No current facility-administered medications on file prior to visit.        Review of Systems  All other systems reviewed and are negative.       Objective:   Physical Exam Physical Exam  Nursing note and vitals reviewed.  Constitutional: She is oriented to person, place, and time. She appears well-developed and well-nourished.  HENT:  Head: Normocephalic and atraumatic.  Right Ear: Tympanic membrane and ear canal normal. No drainage. Tympanic membrane is not injected and not erythematous.  Left Ear: Tympanic membrane and ear canal normal. No drainage. Tympanic membrane is not injected and  not erythematous.  Nose: Nose normal. Right sinus exhibits no maxillary sinus tenderness and no frontal sinus tenderness. Left sinus exhibits no maxillary sinus tenderness and no frontal sinus tenderness.  Mouth/Throat: Oropharynx is clear and moist. No oral lesions. No oropharyngeal exudate.  Eyes: Conjunctivae and EOM are normal. Pupils are equal, round, and reactive to light.  Neck: Normal range of motion. Neck supple. No JVD present. Carotid bruit is not present.  She has palpable thyorid mass on Right  Easily mobile.  Cardiovascular: Normal rate, regular rhythm, S1 normal, S2 normal and intact distal pulses. Exam reveals no gallop and no friction rub.  No murmur heard.  Pulses:  Carotid pulses are 2+ on the right side, and 2+ on the left side.  Dorsalis pedis pulses are 2+ on the right side, and 2+ on the left side.  No carotid bruit. No LE edema  Pulmonary/Chest: Breath sounds normal. She has no wheezes. She has no rales. She exhibits no tenderness. Breast no discrete masses no nipple discharge no axillary adenoapathy bilaterally Abdominal: Soft. Bowel sounds are normal. She exhibits no distension and no mass. There is no hepatosplenomegaly. There is no tenderness. There is no CVA tenderness.  Rectal guaiac neg Musculoskeletal: Normal range of motion.  No active synovitis to joints.  Lymphadenopathy:  She has no cervical adenopathy.  She has no axillary adenopathy.  Right: No inguinal and no supraclavicular adenopathy present.  Left: No inguinal and no supraclavicular adenopathy present.  Neurological: She is alert and oriented to person, place, and time. She has normal strength and normal reflexes. She displays no tremor. No cranial nerve deficit or sensory deficit. Coordination and gait normal.  Skin: Skin is warm and dry. No rash noted. No cyanosis. Nails show no clubbing.  Psychiatric: She has a normal mood and affect. Her speech is normal and behavior is normal. Cognition and memory  are normal.           Assessment & Plan:  Health Maintenance:  Info given on Zostavax.  Pap next year.  Colonoscopy due 2017.  See scANNEd sheet  Abnormal EKG  I have no EKG for comparison.  Will err on the side of caution and get cardiology opinion  Thyroid mass/  H/O MNG with hot solid nodules.   Last imaging in 2000.  Will repeat ultrasound with labs today.  Further management based on results  Symptomatic menopause:   Short course of HT.  She has been on HT now 10 months with great relief  Biologic half-sister with breast cancer.  Pt advised to discuss with  genetic counselor need for formal BRCA1 and BRCA2 testing.  website My23 is negative  Hyperlipdemia DASH diet will recechet today  Former smoker:  Discussed recent guideline for scfreening chest CT.  I believes she has approx 20-30 year pack/years.  She declines imaging at this point.

## 2013-06-25 ENCOUNTER — Telehealth: Payer: Self-pay | Admitting: *Deleted

## 2013-06-25 LAB — CBC WITH DIFFERENTIAL/PLATELET
Basophils Absolute: 0.1 10*3/uL (ref 0.0–0.1)
Basophils Relative: 1 % (ref 0–1)
HCT: 42.5 % (ref 36.0–46.0)
MCH: 33.3 pg (ref 26.0–34.0)
MCHC: 35.1 g/dL (ref 30.0–36.0)
Monocytes Absolute: 0.8 10*3/uL (ref 0.1–1.0)
Monocytes Relative: 12 % (ref 3–12)
Neutro Abs: 4 10*3/uL (ref 1.7–7.7)
Platelets: 253 10*3/uL (ref 150–400)
RBC: 4.47 MIL/uL (ref 3.87–5.11)

## 2013-06-25 LAB — VITAMIN D 25 HYDROXY (VIT D DEFICIENCY, FRACTURES): Vit D, 25-Hydroxy: 43 ng/mL (ref 30–89)

## 2013-06-30 ENCOUNTER — Telehealth: Payer: Self-pay | Admitting: *Deleted

## 2013-06-30 NOTE — Telephone Encounter (Signed)
Message copied by Mathews Robinsons on Mon Jun 30, 2013  2:42 PM ------      Message from: Raechel Chute D      Created: Sun Jun 29, 2013  2:48 PM       Karen Kitchens            Call pt and let her know that her cholesterol is very high.  Schedule a 30 min appt with me before school starts for her            Message back with appt date ------

## 2013-06-30 NOTE — Telephone Encounter (Signed)
Message copied by Mathews Robinsons on Mon Jun 30, 2013  9:49 AM ------      Message from: Raechel Chute D      Created: Sun Jun 29, 2013  2:48 PM       Karen Kitchens            Call pt and let her know that her cholesterol is very high.  Schedule a 30 min appt with me before school starts for her            Message back with appt date ------

## 2013-06-30 NOTE — Telephone Encounter (Signed)
Left VM message to return call  

## 2013-06-30 NOTE — Telephone Encounter (Signed)
appt made for 07/08/13 at 815

## 2013-06-30 NOTE — Telephone Encounter (Signed)
Attempted to call pt on home phone LVM messsage to return call

## 2013-07-01 ENCOUNTER — Telehealth: Payer: Self-pay | Admitting: Internal Medicine

## 2013-07-01 NOTE — Telephone Encounter (Signed)
Left message for pt to call office regarding thyroid ultrasound results

## 2013-07-02 ENCOUNTER — Telehealth: Payer: Self-pay | Admitting: Internal Medicine

## 2013-07-02 DIAGNOSIS — E041 Nontoxic single thyroid nodule: Secondary | ICD-10-CM

## 2013-07-02 NOTE — Telephone Encounter (Signed)
Spoke with pt and informed of thyroid ultrasound results.  Radiologist not sure if bilobed single solid lesion or two adjacent lesions.   Will get endocrinology opinion  Pt voices understanding.  Heather   Will refer to Dr. Elvera Lennox endocrinology

## 2013-07-02 NOTE — Telephone Encounter (Signed)
Called patient and left message about her appointment with Dr Elvera Lennox on 07/21/13 at 1:00 pm

## 2013-07-08 ENCOUNTER — Encounter: Payer: Self-pay | Admitting: Internal Medicine

## 2013-07-08 ENCOUNTER — Ambulatory Visit (INDEPENDENT_AMBULATORY_CARE_PROVIDER_SITE_OTHER): Payer: BC Managed Care – PPO | Admitting: Internal Medicine

## 2013-07-08 VITALS — BP 88/60 | HR 61 | Temp 97.3°F | Resp 18 | Wt 162.0 lb

## 2013-07-08 DIAGNOSIS — E042 Nontoxic multinodular goiter: Secondary | ICD-10-CM

## 2013-07-08 DIAGNOSIS — Z860100 Personal history of colon polyps, unspecified: Secondary | ICD-10-CM

## 2013-07-08 DIAGNOSIS — E785 Hyperlipidemia, unspecified: Secondary | ICD-10-CM

## 2013-07-08 NOTE — Patient Instructions (Addendum)
See me in 6 months

## 2013-07-08 NOTE — Progress Notes (Signed)
  Subjective:    Patient ID: Sonia Adams, female    DOB: Dec 03, 1956, 57 y.o.   MRN: [id number]  HPI Sonia Adams is here for follow up  See lipids.  Pt really does not want any RX meds for this now and would like to try a 6 month low fat diet to see if she can improve her profile.  FH father had MI and CVA  She is a former smoker and remained in tobacco free.    Her Framingham risk score done in office is 4.1%  No cardiac symptoms  See BP  She donated plasma yesterday  She has upcominng appt with both cardiology and endocrinology  Las colonoscopy 2011  Small polyp found.  She would like to check with insurance before see GI MD   Review of Systems See HPI    Objective:   Physical Exam  Physical Exam  Nursing note and vitals reviewed.  Constitutional: She is oriented to person, place, and time. She appears well-developed and well-nourished.  HENT:  Head: Normocephalic and atraumatic.  Cardiovascular: Normal rate and regular rhythm. Exam reveals no gallop and no friction rub.  No murmur heard.  Pulmonary/Chest: Breath sounds normal. She has no wheezes. She has no rales.  Neurological: She is alert and oriented to person, place, and time.  Ext:   No edema Skin: Skin is warm and dry.  Psychiatric: She has a normal mood and affect. Her behavior is normal.            Assessment & Plan:  Hyperlipidemia   Given copy of DASH diet.  Will compromise and let pt try to improve diet for 6 months.  REcheck fasting levels in 6 months  Framingham risk 4.1%  Abnormal EKG  She has appt to see Dr. Myrtis Ser soon  MNG   Upcoming appt with endocrinologist  Colon polyp  She will check with insurance .  She is to call when she is ready for GI referral

## 2013-07-10 ENCOUNTER — Encounter: Payer: Self-pay | Admitting: *Deleted

## 2013-07-15 ENCOUNTER — Ambulatory Visit (INDEPENDENT_AMBULATORY_CARE_PROVIDER_SITE_OTHER): Payer: BC Managed Care – PPO | Admitting: Cardiology

## 2013-07-15 ENCOUNTER — Other Ambulatory Visit: Payer: Self-pay

## 2013-07-15 ENCOUNTER — Encounter: Payer: Self-pay | Admitting: Cardiology

## 2013-07-15 ENCOUNTER — Other Ambulatory Visit (INDEPENDENT_AMBULATORY_CARE_PROVIDER_SITE_OTHER): Payer: BC Managed Care – PPO

## 2013-07-15 VITALS — BP 108/64 | HR 68 | Ht 67.0 in | Wt 162.4 lb

## 2013-07-15 DIAGNOSIS — R9431 Abnormal electrocardiogram [ECG] [EKG]: Secondary | ICD-10-CM

## 2013-07-15 DIAGNOSIS — E785 Hyperlipidemia, unspecified: Secondary | ICD-10-CM

## 2013-07-15 NOTE — Patient Instructions (Signed)
**Note De-Identified Sonia Adams Obfuscation** Your physician recommends that you continue on your current medications as directed. Please refer to the Current Medication list given to you today.   Your physician has requested that you have an echocardiogram. Echocardiography is a painless test that uses sound waves to create images of your heart. It provides your doctor with information about the size and shape of your heart and how well your heart's chambers and valves are working. This procedure takes approximately one hour. There are no restrictions for this procedure.  Your physician recommends that you schedule a follow-up appointment in: depends on Echo results.

## 2013-07-15 NOTE — Assessment & Plan Note (Addendum)
The patient's risk score is low enough to allow for dietary approach to her lipids. However she does have significant LDL elevation that is to be followed up by her primary physician.  As part of today's evaluation I have seen the patient is a new patient. I have reviewed all of her old records carefully. I reviewed her prior EKG in today's EKG. I've had a careful lengthy discussion with her about the approach to her evaluation.

## 2013-07-15 NOTE — Assessment & Plan Note (Signed)
The patient does have decreased R wave in V1 and V2. There is a family history of coronary disease. She does not have any significant symptoms. It is probable that the EKG changes are a a normal variant. However we must prove this by obtaining echo data to be sure that wall motion is normal. I've explained this to her. We will proceed with two-dimensional echo. I will be in touch with her with the information. If the echo is completely normal, she will not need any further cardiac workup. In that case she will not need a followup visit. If the echo is abnormal we will proceed with further evaluation.

## 2013-07-15 NOTE — Progress Notes (Signed)
HPI  The patient is seen today as a new patient for cardiac evaluation. She recently had an EKG that was read as normal related to decreased R wave in lead V1 and V2. She is a former smoker but has not smoked in 6 years. There is a family history of coronary disease with her father having an MI at age 57. She does not have diabetes or hypertension. She is active. She has returned to her walking exercise program. She does not have chest pain or shortness of breath.  Allergies  Allergen Reactions  . Levaquin (Levofloxacin In D5w) Itching and Other (See Comments)    shakiness    Current Outpatient Prescriptions  Medication Sig Dispense Refill  . calcium-vitamin D (OSCAL WITH D) 500-200 MG-UNIT per tablet Take 1 tablet by mouth daily.      Marland Kitchen estradiol (MINIVELLE) 0.05 MG/24HR Apply to skin.  Change twice a week  8 patch  2  . fish oil-omega-3 fatty acids 1000 MG capsule Take 2 g by mouth daily.      . Magnesium 500 MG TABS Take 1 tablet by mouth daily.      . Multiple Vitamin (MULTIVITAMIN) tablet Take 1 tablet by mouth daily.      . progesterone (PROMETRIUM) 100 MG capsule Take 1 capsule (100 mg total) by mouth daily.  30 capsule  2   No current facility-administered medications for this visit.    History   Social History  . Marital Status: Married    Spouse Name: N/A    Number of Children: N/A  . Years of Education: N/A   Occupational History  . Not on file.   Social History Main Topics  . Smoking status: Former Smoker -- 1.00 packs/day for 20 years    Types: Cigarettes    Quit date: 06/01/2007  . Smokeless tobacco: Never Used  . Alcohol Use: Yes     Comment: once a month  . Drug Use: No  . Sexually Active: Yes -- Female partner(s)   Other Topics Concern  . Not on file   Social History Narrative  . No narrative on file    Family History  Problem Relation Age of Onset  . Diabetes Mother   . Alzheimer's disease Mother   . Stroke Father   . Heart disease Father      Past Medical History  Diagnosis Date  . Abnormal pap   . HSV-2 infection   . Menopause     Past Surgical History  Procedure Laterality Date  . Tonsillectomy  1962  . Tubal ligation  1989  . Lumbar fusion  2006    Patient Active Problem List   Diagnosis Date Noted  . Personal history of colonic polyps 07/08/2013  . Nonspecific abnormal electrocardiogram (ECG) (EKG) 06/24/2013  . Multinodular goiter 06/24/2013  . Herniated cervical disc 06/24/2013  . Former smoker 06/24/2013  . Family history of breast cancer in first degree relative 06/24/2013  . Abnormal EKG 06/24/2013  . Asthmatic bronchitis 01/06/2013  . Allergic rhinitis 08/01/2012  . Dyspareunia 08/01/2012  . Symptoms, such as flushing, sleeplessness, headache, lack of concentration, associated with the menopause 08/01/2012  . Hyperlipidemia 08/01/2012  . 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/01/2012  . History of abnormal Pap smear 08/01/2012    ROS   Patient denies fever, chills, headache, sweats, rash, change in vision, change in hearing, chest pain, cough, nausea vomiting, urinary symptoms. All other systems are reviewed and are negative.  PHYSICAL EXAM  Patient  is oriented to person time and place. Affect is normal. There is no jugulovenous distention. There are no carotid bruits. Lungs are clear. Respiratory effort is nonlabored. Cardiac exam reveals S1 and S2. There are no clicks or significant murmurs. The abdomen is soft. There are no bruits heard. There is no peripheral edema. There are no musculoskeletal deformities. There are no skin rashes.  Filed Vitals:   07/15/13 1032  BP: 108/64  Pulse: 68  Height: 5\' 7"  (1.702 m)  Weight: 162 lb 6.4 oz (73.664 kg)  SpO2: 98%   I reviewed the EKG in the computer from June 24, 2013. There is RSR'  in V1. There is decreased anterior R wave in V1 and V2. EKG was repeated today to see if there was any variation. It looks the same. Once again there is RSR' in V1. On this  occasion the computer has read incomplete right bundle branch block. There is decreased R wave in V1 and V2 similar to the prior tracing.  ASSESSMENT & PLAN

## 2013-07-16 ENCOUNTER — Other Ambulatory Visit: Payer: Self-pay | Admitting: Internal Medicine

## 2013-07-16 NOTE — Telephone Encounter (Signed)
Refill request

## 2013-07-21 ENCOUNTER — Ambulatory Visit (INDEPENDENT_AMBULATORY_CARE_PROVIDER_SITE_OTHER): Payer: BC Managed Care – PPO | Admitting: Internal Medicine

## 2013-07-21 ENCOUNTER — Encounter: Payer: Self-pay | Admitting: Internal Medicine

## 2013-07-21 VITALS — BP 118/68 | HR 71 | Temp 98.5°F | Resp 12 | Ht 67.0 in | Wt 164.0 lb

## 2013-07-21 DIAGNOSIS — E042 Nontoxic multinodular goiter: Secondary | ICD-10-CM

## 2013-07-21 NOTE — Progress Notes (Signed)
Patient ID: Sonia Adams, female   DOB: August 27, 1956, 57 y.o.   MRN: [id number]   HPI  Sonia Adams is a 57 y.o.-year-old female, referred by her PCP, Dr. Constance Goltz, for evaluation for MNG.  I reviewed patient's chart and previous thyroid evaluations: In 2000, she was found to have a goiter by palpation >> sent for U/S: - Thyroid U/S (03/21/1999): right lobe larger than the left, measuring 5 x 1.4 x 2 cm, containing 2 adjacent nodules of 1.3 x 1.4 x 2.7 cm (half cystic and half solid, with some internal calcifications), and the other one at 0.8 x 0.8 x 0.9 cm (also cystic and solid). Small, 3 x 3 x 4 mm nodule in the left lobe. - Thyroid scan (03/26/1999): multiple focal hot nodules corresponding to nodule seen on ultrasound. None appear cold. Nodules on the right are more hyperactive than those on the left. (she explained that the thyroid scan was performed because the thyroid ultrasound images were "inconclusive"?) - Thyroid U/S (06/24/2013): The thyroid lobes measure 3.9 x 2.2 x 1.8 cm - right, 3.8 x 1.5 x 1.3 cm - left. Again, 2 adjacent nodules in the right lobe, measuring in total dimensions 2.2 x 1.6 x 1.2 cm, appears smaller than previously seen (at 2.7 cm in the largest dimension). Tiny bilateral subcentimeter nodules, with the largest measuring 7 mm. No lymphadenopathy.  I reviewed pt's thyroid tests: Lab Results  Component Value Date   TSH 1.039 06/24/2013    Pt denies feeling nodules in neck, no hoarseness, + dysphagia - ever since her cervical fusion surgery in 2006/no odynophagia, no SOB with lying down.  Pt c/o: - no heat intolerance/cold intolrance - no tremors - no anxiety/depression - no palpitations - no problems with concentration - + fatigue - no hyperdefecation/constipation - no weight loss - + weight gain: 15 lbs in past 10 years (perimenopause)  Pt does not have a FH of thyroid ds. No FH of thyroid cancer. No h/o radiation tx to head or neck.  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  Past Medical History  Diagnosis Date  . Abnormal pap   . HSV-2 infection   . Menopause     Past Surgical History  Procedure Laterality Date  . Tonsillectomy  1962  . Tubal ligation  1989  . Lumbar fusion  2006    History   Social History  . Marital Status: Married    Spouse Name: N/A    Number of Children: 0   Occupational History  . 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 Smoking status: Former Smoker -- 1.00 packs/day for 20 years    Types: Cigarettes    Quit date: 06/01/2007  . Smokeless tobacco: Never Used  . Alcohol Use: Yes     Comment: once a month  . Drug Use: No  . Sexually Active: Yes -- Female partner(s)   Current Outpatient Prescriptions on File Prior to Visit  Medication Sig Dispense Refill  . calcium-vitamin D (OSCAL WITH D) 500-200 MG-UNIT per tablet Take 1 tablet by mouth daily.      . fish oil-omega-3 fatty acids 1000 MG capsule Take 2 g by mouth daily.      . Magnesium 500 MG TABS Take 1 tablet by mouth daily.      Marland Kitchen  MINIVELLE 0.05 MG/24HR APPLY TO SKIN.  CHANGE TWICE A WEEK AS DIRECTED.  8 patch  3  . Multiple Vitamin (MULTIVITAMIN) tablet Take 1 tablet by mouth daily.      . progesterone (PROMETRIUM) 100 MG capsule TAKE 1 CAPSULE BY MOUTH DAILY  30 capsule  2   No current facility-administered medications on file prior to visit.   Allergies  Allergen Reactions  . Levaquin (Levofloxacin In D5w) Itching and Other (See Comments)    shakiness   Family History  Problem Relation Age of Onset  . Diabetes Mother   . Alzheimer's disease Mother   . Stroke Father   . Heart disease Father    ROS: Constitutional: please see history of present illness Eyes: Some blurry vision - needs reading glasses, no xerophthalmia ENT: no sore  throat, no nodules palpated in throat, no dysphagia/odynophagia, no hoarseness Cardiovascular: no CP/SOB/palpitations/leg swelling Respiratory: no cough/SOB Gastrointestinal: no N/V/D/+ C Musculoskeletal: + muscle aches/joint aches Skin: no rashes Neurological: no tremors/numbness/tingling/dizziness Psychiatric: no depression/anxiety  PE: BP 118/68  Pulse 71  Temp(Src) 98.5 F (36.9 C) (Oral)  Resp 12  Ht 5\' 7"  (1.702 m)  Wt 164 lb (74.39 kg)  BMI 25.68 kg/m2  SpO2 98%  LMP 01/01/2013 Wt Readings from Last 3 Encounters:  07/21/13 164 lb (74.39 kg)  07/15/13 162 lb 6.4 oz (73.664 kg)  07/08/13 162 lb (73.483 kg)   Constitutional: normal weight, in NAD Eyes: PERRLA, EOMI, no exophthalmos ENT: moist mucous membranes, + thyromegaly, R>L, no cervical lymphadenopathy Cardiovascular: RRR, No MRG Respiratory: CTA B Gastrointestinal: abdomen soft, NT, ND, BS+ Musculoskeletal: no deformities, strength intact in all 4;  Skin: moist, warm, no rashes Neurological: no tremor with outstretched hands, DTR normal in all 4  ASSESSMENT: 1. MNG - Thyroid U/S (03/21/1999): right lobe larger than the left, measuring 5 x 1.4 x 2 cm, containing 2 adjacent nodules of 1.3 x 1.4 x 2.7 cm (half cystic and half solid, with some internal calcifications), and the other one at 0.8 x 0.8 x 0.9 cm (also cystic and solid). Small, 3 x 3 x 4 mm nodule in the left lobe. - Thyroid scan (03/26/1999): multiple focal hot nodules corresponding to nodule seen on ultrasound. None appear cold. Nodules on the right are more hyperactive than those on the left. (she explained that the thyroid scan was performed because the thyroid ultrasound images were "inconclusive"?) - Thyroid U/S (06/24/2013): The thyroid lobes measure 3.9 x 2.2 x 1.8 cm - right, 3.8 x 1.5 x 1.3 cm - left. Again, 2 adjacent nodules in the right lobe, measuring in total dimensions 2.2 x 1.6 x 1.2 cm, appears smaller than previously seen (at 2.7 cm in the  largest dimension). Tiny bilateral subcentimeter nodules, with the largest measuring 7 mm. No lymphadenopathy.  PLAN: 1. MNG  - I reviewed the images of her thyroid ultrasound along with the patient. The dominant lesion is formed is likely by 2 adjacent nodules close together; comparing the reports from 2000 and 2014, there is no difference in the size of the nodule. I could not review the thyroid ultrasound images from 2000, only the report. I pointed out that the dominant nodule(s) are large, solid-cystic, with calcifications, without internal blood flow, and well delimited from surrounding tissue. Pt does not have a thyroid cancer family history or a personal history of RxTx to head/neck, favoring benignity. I believe her risk of cancer is less than 10-15%. We discussed about the indolent nature of the thyroid  cancer, even if her lesions turn out to be malignant. - I explained that the only way that we can tell exactly if it is cancer or not is by doing a thyroid biopsy (FNA). We discussed about other option to wait for another year and see if the nodule grows, and only intervene at that time. I have low suspicion for interval increase in size, since the nodules did not grow over 14 year period. I think this is a very good sign, but again, cannot exclude malignancy completely. The patient opted to wait a year to see the thyroid nodule will grow. - she should let me know if she develops neck compression symptoms, in that case, we might need to do lobectomy  - I did explain that, while hemithyroidectomy surgery is not a complicated one, it still can have side effects and also she might have a risk of ~24% of becoming hypothyroid - patient decided to wait for another year, and recheck an ultrasound at that time - I'll see her back in a year. If she develops neck compression symptoms, we will meet sooner.  - I advised pt to contact me through my chart  - no labs for today

## 2013-07-21 NOTE — Patient Instructions (Addendum)
Please let me know if you develop problems swallowing or breathing or you develop hoarseness. Please return in 1 year.

## 2013-07-22 ENCOUNTER — Encounter: Payer: Self-pay | Admitting: Cardiology

## 2013-07-22 NOTE — Progress Notes (Signed)
   I saw the patient in the office recently to help with the evaluation of her EKG. Two-dimensional echo was ordered. It is completely normal. The ejection fraction is 55-60%. There are no focal wall motion abnormalities. Diastolic function is normal. Right ventricular function is normal.  The patient's EKG changes are a normal variant with decreased R waves in V1 and V2. She has normal left ventricular function. No further cardiac workup is needed. We will be contacting the patient with all of the results.  Jerral Bonito, MD

## 2013-08-05 NOTE — Telephone Encounter (Signed)
error 

## 2013-08-06 ENCOUNTER — Institutional Professional Consult (permissible substitution): Payer: BC Managed Care – PPO | Admitting: Cardiology

## 2013-10-13 ENCOUNTER — Other Ambulatory Visit: Payer: Self-pay | Admitting: *Deleted

## 2013-10-13 ENCOUNTER — Telehealth: Payer: Self-pay | Admitting: *Deleted

## 2013-10-13 NOTE — Telephone Encounter (Signed)
error 

## 2013-10-13 NOTE — Telephone Encounter (Signed)
Sent coupon for minivelle to pt via mail

## 2013-10-23 ENCOUNTER — Other Ambulatory Visit: Payer: Self-pay

## 2013-11-10 ENCOUNTER — Other Ambulatory Visit: Payer: Self-pay | Admitting: Internal Medicine

## 2013-11-11 NOTE — Telephone Encounter (Signed)
LVM message for pt to return call regarding 2014 MM and refill request

## 2013-12-01 ENCOUNTER — Other Ambulatory Visit: Payer: Self-pay | Admitting: Internal Medicine

## 2014-01-05 ENCOUNTER — Ambulatory Visit (INDEPENDENT_AMBULATORY_CARE_PROVIDER_SITE_OTHER): Payer: BC Managed Care – PPO | Admitting: Internal Medicine

## 2014-01-05 ENCOUNTER — Encounter: Payer: Self-pay | Admitting: Internal Medicine

## 2014-01-05 VITALS — BP 110/68 | HR 69 | Temp 98.2°F | Resp 18 | Wt 169.0 lb

## 2014-01-05 DIAGNOSIS — N951 Menopausal and female climacteric states: Secondary | ICD-10-CM

## 2014-01-05 DIAGNOSIS — E042 Nontoxic multinodular goiter: Secondary | ICD-10-CM

## 2014-01-05 DIAGNOSIS — E785 Hyperlipidemia, unspecified: Secondary | ICD-10-CM

## 2014-01-05 DIAGNOSIS — R9431 Abnormal electrocardiogram [ECG] [EKG]: Secondary | ICD-10-CM

## 2014-01-05 LAB — LIPID PANEL
CHOL/HDL RATIO: 3.3 ratio
CHOLESTEROL: 223 mg/dL — AB (ref 0–200)
HDL: 68 mg/dL (ref >39)
LDL Cholesterol: 143 mg/dL — ABNORMAL HIGH (ref 0–99)
TRIGLYCERIDES: 61 mg/dL (ref <150)
VLDL: 12 mg/dL (ref 0–40)

## 2014-01-05 NOTE — Patient Instructions (Signed)
Get CHillow for night sweats  Can use I-cool for mild flushes  Over the counter.    See me as needed

## 2014-01-05 NOTE — Progress Notes (Signed)
   Subjective:    Patient ID: LANCE GALAS, female    DOB: [birth year], 58 y.o.   MRN: [id number]  HPI Brogan is here for follow up.    See Dr. Ron Parker and Dr. Arman Filter notes.  Pt had ECHO  for poor R wave progression.  ECHO completely normal     MNG  She has been evaluataed by Dr. Cruzita Lederer and offered biopsy for conclusive answer pt has opted to wait and reimage in one year.  She has been trying to follow low fat diet  She reports she has come off her HT and flushing only slightly.     Review of Systems See HPI    Objective:   Physical Exam Physical Exam  Nursing note and vitals reviewed.  Constitutional: She is oriented to person, place, and time. She appears well-developed and well-nourished.  HENT:  Head: Normocephalic and atraumatic.  Cardiovascular: Normal rate and regular rhythm. Exam reveals no gallop and no friction rub.  No murmur heard.  Pulmonary/Chest: Breath sounds normal. She has no wheezes. She has no rales.  Neurological: She is alert and oriented to person, place, and time.  Skin: Skin is warm and dry.  Psychiatric: She has a normal mood and affect. Her behavior is normal.         Assessment & Plan:  Hyperlipidemia  Will check levels today  MNG  Follow up with Dr. Cruzita Lederer in one year  Symptomatic  Menopause:  Off HT now.  Advised cooling pillow and OK for i-cool  OTC    Schedule CPE

## 2014-04-30 ENCOUNTER — Encounter (HOSPITAL_COMMUNITY): Payer: Self-pay | Admitting: Emergency Medicine

## 2014-04-30 ENCOUNTER — Emergency Department (HOSPITAL_COMMUNITY)
Admission: EM | Admit: 2014-04-30 | Discharge: 2014-04-30 | Disposition: A | Discharge status: Home / Self Care | Payer: BC Managed Care – PPO | Source: Home / Self Care | Attending: Family Medicine | Admitting: Family Medicine

## 2014-04-30 DIAGNOSIS — L237 Allergic contact dermatitis due to plants, except food: Secondary | ICD-10-CM

## 2014-04-30 DIAGNOSIS — L255 Unspecified contact dermatitis due to plants, except food: Secondary | ICD-10-CM

## 2014-04-30 MED ORDER — METHYLPREDNISOLONE ACETATE 40 MG/ML IJ SUSP
80.0000 mg | Freq: Once | INTRAMUSCULAR | Status: AC
  Administered 2014-04-30: 80 mg via INTRAMUSCULAR

## 2014-04-30 MED ORDER — TRIAMCINOLONE ACETONIDE 40 MG/ML IJ SUSP
INTRAMUSCULAR | Status: AC
  Filled 2014-04-30: qty 1

## 2014-04-30 MED ORDER — TRIAMCINOLONE ACETONIDE 40 MG/ML IJ SUSP
40.0000 mg | Freq: Once | INTRAMUSCULAR | Status: AC
  Administered 2014-04-30: 40 mg via INTRAMUSCULAR

## 2014-04-30 MED ORDER — METHYLPREDNISOLONE ACETATE 80 MG/ML IJ SUSP
INTRAMUSCULAR | Status: AC
  Filled 2014-04-30: qty 1

## 2014-04-30 MED ORDER — FLUTICASONE PROPIONATE 0.05 % EX CREA: TOPICAL_CREAM | Freq: Two times a day (BID) | CUTANEOUS | Status: DC

## 2014-04-30 NOTE — ED Provider Notes (Signed)
CSN: [id number]     Arrival date & time 04/30/14  1502 History   First MD Initiated Contact with Patient 04/30/14 1542     Chief Complaint  Patient presents with  . Rash   (Consider location/radiation/quality/duration/timing/severity/associated sxs/prior Treatment) Patient is a 58 y.o. female presenting with rash. The history is provided by the patient.  Rash Location:  Shoulder/arm Shoulder/arm rash location:  L forearm and R forearm Quality: blistering   Duration:  3 days Chronicity:  New Context: plant contact   Context comment:  Onset after working in yard planting flowers.over weekend. Relieved by:  None tried Worsened by:  Nothing tried   Past Medical History  Diagnosis Date  . Abnormal pap   . HSV-2 infection   . Menopause    Past Surgical History  Procedure Laterality Date  . Tonsillectomy  1962  . Tubal ligation  1989  . Lumbar fusion  2006   Family History  Problem Relation Age of Onset  . Diabetes Mother   . Alzheimer's disease Mother   . Stroke Father   . Heart disease Father    History  Substance Use Topics  . Smoking status: Former Smoker -- 1.00 packs/day for 20 years    Types: Cigarettes    Quit date: 06/01/2007  . Smokeless tobacco: Never Used  . Alcohol Use: Yes     Comment: once a month   OB History   Grav Para Term Preterm Abortions TAB SAB Ect Mult Living   0 0 0 0 0 0 0 0 0 0      Review of Systems  Constitutional: Negative.   Skin: Positive for rash.    Allergies  Levaquin  Home Medications   Prior to Admission medications   Medication Sig Start Date End Date Taking? Authorizing Provider  albuterol (PROVENTIL HFA;VENTOLIN HFA) 108 (90 BASE) MCG/ACT inhaler Inhale 2 puffs into the lungs 2 (two) times daily as needed for wheezing or shortness of breath.   Yes Historical Provider, MD  calcium-vitamin D (OSCAL WITH D) 500-200 MG-UNIT per tablet Take 1 tablet by mouth daily.    Historical Provider, MD  fish oil-omega-3 fatty acids  1000 MG capsule Take 2 g by mouth daily.    Historical Provider, MD  Magnesium 500 MG TABS Take 1 tablet by mouth daily.    Historical Provider, MD  Multiple Vitamin (MULTIVITAMIN) tablet Take 1 tablet by mouth daily.    Historical Provider, MD   BP 126/78  Pulse 64  Temp(Src) 98 F (36.7 C) (Oral)  Resp 16  SpO2 98%  LMP 01/01/2013 Physical Exam  Nursing note and vitals reviewed. Constitutional: She is oriented to person, place, and time. She appears well-developed and well-nourished.  Neurological: She is alert and oriented to person, place, and time.  Skin: Skin is warm and dry. Rash noted.  Patchy weeping blistering rash to bilat volar wrists, no infection.    ED Course  Procedures (including critical care time) Labs Review Labs Reviewed - No data to display  Imaging Review No results found.   MDM   1. Contact dermatitis due to poison ivy        Billy Fischer, MD 04/30/14 [phone]

## 2014-04-30 NOTE — ED Notes (Addendum)
Had been working yard earlier this week  , ?contact dermatitis bilateral forearm area

## 2014-06-28 NOTE — Progress Notes (Addendum)
Subjective:    Patient ID: Sonia Adams, female    DOB: 06-01-1956, 58 y.o.   MRN: [id number]  HPI Sonia Adams is here for CPE.  Doing well  Had trip to [place] with sister and niece this summer  HM:  She knows she is overdue for MM last done 8/13 ,  Colonoscopy done 2011,  Dexa 7/11 normal, pap 2013 normal    MNG:  Was offered biopsy by endocrine  Pt declined and decided to follow by imaging.  She has known MNG  Hyperlipidemia  Does not want RX meds.  Has been watching diet   Abd EKG  Poor r wave prgression  Evaluated by cardiology .  Had normal ECHO  Colon polyps  See above   Needs repeat next year  Sister breast CA  Pt had online BRCA testing which she reports was negative.     Former tobacco use :  Pt counseled lung cancer screening guideline  35 pack year smoker  Allergies  Allergen Reactions  . Levaquin [Levofloxacin In D5w] Itching and Other (See Comments)    shakiness   Past Medical History  Diagnosis Date  . Abnormal pap   . HSV-2 infection   . Menopause    Past Surgical History  Procedure Laterality Date  . Tonsillectomy  1962  . Tubal ligation  1989  . Lumbar fusion  2006   History   Social History  . Marital Status: Married    Spouse Name: N/A    Number of Children: N/A  . Years of Education: N/A   Occupational History  . Not on file.   Social History Main Topics  . Smoking status: Former Smoker -- 1.00 packs/day for 20 years    Types: Cigarettes    Quit date: 06/01/2007  . Smokeless tobacco: Never Used  . Alcohol Use: Yes     Comment: once a month  . Drug Use: No  . Sexual Activity: Yes    Partners: Male   Other Topics Concern  . Not on file   Social History Narrative  . No narrative on file   Family History  Problem Relation Age of Onset  . Diabetes Mother   . Alzheimer's disease Mother   . Stroke Father   . Heart disease Father    Patient Active Problem List   Diagnosis Date Noted  . Personal history of colonic polyps  07/08/2013  . Nonspecific abnormal electrocardiogram (ECG) (EKG) 06/24/2013  . Multinodular goiter 06/24/2013  . Herniated cervical disc 06/24/2013  . Former smoker 06/24/2013  . Family history of breast cancer in first degree relative 06/24/2013  . Abnormal EKG 06/24/2013  . Asthmatic bronchitis 01/06/2013  . Allergic rhinitis 08/01/2012  . Dyspareunia 08/01/2012  . Symptoms, such as flushing, sleeplessness, headache, lack of concentration, associated with the menopause 08/01/2012  . Hyperlipidemia 08/01/2012  . 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/01/2012  . History of abnormal Pap smear 08/01/2012   Current Outpatient Prescriptions on File Prior to Visit  Medication Sig Dispense Refill  . albuterol (PROVENTIL HFA;VENTOLIN HFA) 108 (90 BASE) MCG/ACT inhaler Inhale 2 puffs into the lungs 2 (two) times daily as needed for wheezing or shortness of breath.      . calcium-vitamin D (OSCAL WITH D) 500-200 MG-UNIT per tablet Take 1 tablet by mouth daily.      . fish oil-omega-3 fatty acids 1000 MG capsule Take 2 g by mouth daily.      . fluticasone (CUTIVATE) 0.05 % cream Apply  topically 2 (two) times daily.  30 g  1  . Magnesium 500 MG TABS Take 1 tablet by mouth daily.      . Multiple Vitamin (MULTIVITAMIN) tablet Take 1 tablet by mouth daily.       No current facility-administered medications on file prior to visit.       Review of Systems  Respiratory: Negative for chest tightness, shortness of breath and wheezing.   Cardiovascular: Negative for chest pain, palpitations and leg swelling.  Gastrointestinal: Negative for abdominal pain.       Objective:   Physical Exam Physical Exam  Vital signs and nursing note reviewed  Constitutional: She is oriented to person, place, and time. She appears well-developed and well-nourished. She is cooperative.  HENT:  Head: Normocephalic and atraumatic.  Right Ear: Tympanic membrane normal.  Left Ear: Tympanic membrane normal.  Nose: Nose  normal.  Mouth/Throat: Oropharynx is clear and moist and mucous membranes are normal. No oropharyngeal exudate or posterior oropharyngeal erythema.  Eyes: Conjunctivae and EOM are normal. Pupils are equal, round, and reactive to light.  Neck: Neck supple. No JVD present. Carotid bruit is not present. No mass and no thyromegaly present.  Cardiovascular: Regular rhythm, normal heart sounds, intact distal pulses and normal pulses.  Exam reveals no gallop and no friction rub.   No murmur heard. Pulses:      Dorsalis pedis pulses are 2+ on the right side, and 2+ on the left side.  Pulmonary/Chest: Breath sounds normal. She has no wheezes. She has no rhonchi. She has no rales. Right breast exhibits no mass, no nipple discharge and no skin change. Left breast exhibits no mass, no nipple discharge and no skin change.  Abdominal: Soft. Bowel sounds are normal. She exhibits no distension and no mass. There is no hepatosplenomegaly. There is no tenderness. There is no CVA tenderness.  Genitourinary: Rectum normal, vagina normal and uterus normal. Rectal exam shows no mass. Guaiac negative stool. No labial fusion. There is no lesion on the right labia. There is no lesion on the left labia. Cervix exhibits no motion tenderness. Right adnexum displays no mass, no tenderness and no fullness. Left adnexum displays no mass, no tenderness and no fullness. No erythema around the vagina.  Musculoskeletal:       No active synovitis to any joint.    Lymphadenopathy:       Right cervical: No superficial cervical adenopathy present.      Left cervical: No superficial cervical adenopathy present.       Right axillary: No pectoral and no lateral adenopathy present.       Left axillary: No pectoral and no lateral adenopathy present.      Right: No inguinal adenopathy present.       Left: No inguinal adenopathy present.  Neurological: She is alert and oriented to person, place, and time. She has normal strength and normal  reflexes. No cranial nerve deficit or sensory deficit. She displays a negative Romberg sign. Coordination and gait normal.  Skin: Skin is warm and dry. No abrasion, no bruising, no ecchymosis and no rash noted. No cyanosis. Nails show no clubbing.  Psychiatric: She has a normal mood and affect. Her speech is normal and behavior is normal.          Assessment & Plan:   HM:  Pap today.   Smoker for 35 year history  Advised lung cancer screeining with  chest Ct.  Pt declines Ct at this point - advised  to call when ready.       Advised to get mm.  Colonoscopy due next year (W/S).  Infor given for Zostavax  Hyperlipidemia  Recheck today  MNG:  See above .  Followed by endocrine  Abnormal EKG   ECho neg.  No furhter chest pain  Sister with breast CA  See above advised yearly mm  Colon polyps  See above  Histoyr of gential herpes  Has not had an outbreak in many years  Perimenopause  Off HT with sister with breast CA,  OTC lubricants for vaginal dryness  See me as needed  Addendum 7/15   THS is normal  I will have Heahter call pt to remind her that she should keep the yearly follow up appoint ment with Dr. Cruzita Lederer        Assessment & Plan:

## 2014-06-29 ENCOUNTER — Ambulatory Visit (INDEPENDENT_AMBULATORY_CARE_PROVIDER_SITE_OTHER): Payer: BC Managed Care – PPO | Admitting: Internal Medicine

## 2014-06-29 ENCOUNTER — Encounter: Payer: Self-pay | Admitting: Internal Medicine

## 2014-06-29 VITALS — BP 106/67 | HR 66 | Resp 16 | Ht 67.0 in | Wt 159.0 lb

## 2014-06-29 DIAGNOSIS — Z0000 Encounter for general adult medical examination without abnormal findings: Secondary | ICD-10-CM

## 2014-06-29 DIAGNOSIS — E785 Hyperlipidemia, unspecified: Secondary | ICD-10-CM

## 2014-06-29 DIAGNOSIS — Z1151 Encounter for screening for human papillomavirus (HPV): Secondary | ICD-10-CM

## 2014-06-29 DIAGNOSIS — Z803 Family history of malignant neoplasm of breast: Secondary | ICD-10-CM

## 2014-06-29 DIAGNOSIS — E042 Nontoxic multinodular goiter: Secondary | ICD-10-CM

## 2014-06-29 DIAGNOSIS — Z124 Encounter for screening for malignant neoplasm of cervix: Secondary | ICD-10-CM

## 2014-06-29 DIAGNOSIS — R9431 Abnormal electrocardiogram [ECG] [EKG]: Secondary | ICD-10-CM

## 2014-06-29 DIAGNOSIS — R87619 Unspecified abnormal cytological findings in specimens from cervix uteri: Secondary | ICD-10-CM

## 2014-06-29 DIAGNOSIS — Z87891 Personal history of nicotine dependence: Secondary | ICD-10-CM

## 2014-06-29 LAB — COMPREHENSIVE METABOLIC PANEL
ALK PHOS: 61 U/L (ref 39–117)
ALT: 15 U/L (ref 0–35)
AST: 23 U/L (ref 0–37)
Albumin: 4.2 g/dL (ref 3.5–5.2)
BILIRUBIN TOTAL: 0.5 mg/dL (ref 0.2–1.2)
BUN: 11 mg/dL (ref 6–23)
CO2: 31 mEq/L (ref 19–32)
CREATININE: 0.64 mg/dL (ref 0.50–1.10)
Calcium: 9.1 mg/dL (ref 8.4–10.5)
Chloride: 102 mEq/L (ref 96–112)
GLUCOSE: 78 mg/dL (ref 70–99)
Potassium: 4.3 mEq/L (ref 3.5–5.3)
SODIUM: 143 meq/L (ref 135–145)
TOTAL PROTEIN: 6.2 g/dL (ref 6.0–8.3)

## 2014-06-29 LAB — CBC WITH DIFFERENTIAL/PLATELET
Basophils Absolute: 0 10*3/uL (ref 0.0–0.1)
Basophils Relative: 0 % (ref 0–1)
EOS ABS: 0.1 10*3/uL (ref 0.0–0.7)
EOS PCT: 1 % (ref 0–5)
HEMATOCRIT: 40.6 % (ref 36.0–46.0)
HEMOGLOBIN: 14.1 g/dL (ref 12.0–15.0)
LYMPHS ABS: 1.7 10*3/uL (ref 0.7–4.0)
LYMPHS PCT: 25 % (ref 12–46)
MCH: 33.3 pg (ref 26.0–34.0)
MCHC: 34.7 g/dL (ref 30.0–36.0)
MCV: 96 fL (ref 78.0–100.0)
MONO ABS: 0.7 10*3/uL (ref 0.1–1.0)
MONOS PCT: 10 % (ref 3–12)
Neutro Abs: 4.2 10*3/uL (ref 1.7–7.7)
Neutrophils Relative %: 64 % (ref 43–77)
PLATELETS: 260 10*3/uL (ref 150–400)
RBC: 4.23 MIL/uL (ref 3.87–5.11)
RDW: 14 % (ref 11.5–15.5)
WBC: 6.6 10*3/uL (ref 4.0–10.5)

## 2014-06-29 LAB — LIPID PANEL
CHOL/HDL RATIO: 3.5 ratio
Cholesterol: 185 mg/dL (ref 0–200)
HDL: 53 mg/dL (ref >39)
LDL CALC: 120 mg/dL — AB (ref 0–99)
TRIGLYCERIDES: 59 mg/dL (ref <150)
VLDL: 12 mg/dL (ref 0–40)

## 2014-06-29 LAB — POCT URINALYSIS DIPSTICK
Bilirubin, UA: NEGATIVE
GLUCOSE UA: NEGATIVE
KETONES UA: NEGATIVE
Leukocytes, UA: NEGATIVE
Nitrite, UA: NEGATIVE
PROTEIN UA: NEGATIVE
SPEC GRAV UA: 1.01
UROBILINOGEN UA: 0.2
pH, UA: 7.5

## 2014-06-29 NOTE — Patient Instructions (Signed)
See me as needed 

## 2014-06-30 LAB — TSH: TSH: 1.347 u[IU]/mL (ref 0.350–4.500)

## 2014-06-30 LAB — CYTOLOGY - PAP

## 2014-06-30 LAB — CULTURE, URINE COMPREHENSIVE
Colony Count: NO GROWTH
Organism ID, Bacteria: NO GROWTH

## 2014-06-30 LAB — VITAMIN D 25 HYDROXY (VIT D DEFICIENCY, FRACTURES): Vit D, 25-Hydroxy: 48 ng/mL (ref 30–89)

## 2014-07-01 ENCOUNTER — Telehealth: Payer: Self-pay | Admitting: Internal Medicine

## 2014-07-01 NOTE — Telephone Encounter (Signed)
Sonia Adams  Call pt and let her know that her thyroid blood test is normal but advise her to make an appt with Dr. Cruzita Lederer her endocrinologist who wanted to see her in a year - she is due to see her now.   Ok to mail labs to her

## 2014-07-01 NOTE — Telephone Encounter (Signed)
Called [facility] and went over lab work.  Gave her Dr Arman Filter #. She will call and make follow up appt.

## 2014-07-24 ENCOUNTER — Encounter: Payer: Self-pay | Admitting: *Deleted

## 2014-12-08 ENCOUNTER — Encounter: Payer: Self-pay | Admitting: Internal Medicine

## 2014-12-08 ENCOUNTER — Ambulatory Visit (INDEPENDENT_AMBULATORY_CARE_PROVIDER_SITE_OTHER): Payer: BC Managed Care – PPO | Admitting: Internal Medicine

## 2014-12-08 VITALS — BP 103/55 | HR 58 | Resp 16 | Ht 67.0 in | Wt 169.0 lb

## 2014-12-08 DIAGNOSIS — R21 Rash and other nonspecific skin eruption: Secondary | ICD-10-CM

## 2014-12-08 NOTE — Patient Instructions (Signed)
See me as needed 

## 2014-12-08 NOTE — Progress Notes (Signed)
Subjective:    Patient ID: Sonia Adams, female    DOB: May 14, 1956, 58 y.o.   MRN: [id number]  HPI Early November  Had red macule on R forearm .  Spread to left forearm and then was scraped on left forearm.  Wound healed well.  2 days ago red flat macule returned on left forearm.   No weeping no itching.  No lypmh nodules felt by pt  Allergies  Allergen Reactions  . Levaquin [Levofloxacin In D5w] Itching and Other (See Comments)    shakiness   Past Medical History  Diagnosis Date  . Abnormal pap   . HSV-2 infection   . Menopause    Past Surgical History  Procedure Laterality Date  . Tonsillectomy  1962  . Tubal ligation  1989  . Lumbar fusion  2006   History   Social History  . Marital Status: Married    Spouse Name: N/A    Number of Children: N/A  . Years of Education: N/A   Occupational History  . Not on file.   Social History Main Topics  . Smoking status: Former Smoker -- 1.00 packs/day for 20 years    Types: Cigarettes    Quit date: 06/01/2007  . Smokeless tobacco: Never Used  . Alcohol Use: Yes     Comment: once a month  . Drug Use: No  . Sexual Activity:    Partners: Male    Birth Control/ Protection: Post-menopausal   Other Topics Concern  . Not on file   Social History Narrative   Family History  Problem Relation Age of Onset  . Diabetes Mother   . Alzheimer's disease Mother   . Stroke Father   . Heart disease Father    Patient Active Problem List   Diagnosis Date Noted  . Personal history of colonic polyps 07/08/2013  . Nonspecific abnormal electrocardiogram (ECG) (EKG) 06/24/2013  . Multinodular goiter 06/24/2013  . Herniated cervical disc 06/24/2013  . Former smoker  29 year  declines screening chest CT  cessation 2008 06/24/2013  . Family history of breast cancer in first degree relative 06/24/2013  . Abnormal EKG 06/24/2013  . Asthmatic bronchitis 01/06/2013  . Allergic rhinitis 08/01/2012  . Dyspareunia 08/01/2012  .  Symptoms, such as flushing, sleeplessness, headache, lack of concentration, associated with the menopause 08/01/2012  . Hyperlipidemia 08/01/2012  . 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/01/2012  . Abnormal Pap smear of cervix 08/01/2012   Current Outpatient Prescriptions on File Prior to Visit  Medication Sig Dispense Refill  . albuterol (PROVENTIL HFA;VENTOLIN HFA) 108 (90 BASE) MCG/ACT inhaler Inhale 2 puffs into the lungs 2 (two) times daily as needed for wheezing or shortness of breath.    . calcium-vitamin D (OSCAL WITH D) 500-200 MG-UNIT per tablet Take 1 tablet by mouth daily.    . fish oil-omega-3 fatty acids 1000 MG capsule Take 2 g by mouth daily.    . Magnesium 500 MG TABS Take 1 tablet by mouth daily.    . Multiple Vitamin (MULTIVITAMIN) tablet Take 1 tablet by mouth daily.    . Potassium 75 MG TABS Take 1 tablet by mouth daily.     No current facility-administered medications on file prior to visit.       Review of Systems See HPI    Objective:   Physical Exam Physical Exam  Nursing note and vitals reviewed.  Constitutional: She is oriented to person, place, and time. She appears well-developed and well-nourished.  HENT:  Head:  Normocephalic and atraumatic.  Cardiovascular: Normal rate and regular rhythm. Exam reveals no gallop and no friction rub.  No murmur heard.  Pulmonary/Chest: Breath sounds normal. She has no wheezes. She has no rales.  Neurological: She is alert and oriented to person, place, and time.  Skin: Skin is warm and dry. Left forearm  Less than 5 mm red macule left forearm   No cellulitis Lymph no epitrochlear no axillary no cervical adenopathy  M/S  No synovitis Psychiatric: She has a normal mood and affect. Her behavior is normal.              Assessment & Plan:  Rash unclear etiology  Pt has seen Dr. Tonia Brooms int he past  She will call to make appt with her

## 2015-10-12 ENCOUNTER — Other Ambulatory Visit: Payer: Self-pay | Admitting: Family Medicine

## 2015-10-12 DIAGNOSIS — R4702 Dysphasia: Secondary | ICD-10-CM

## 2015-10-14 ENCOUNTER — Other Ambulatory Visit: Payer: BC Managed Care – PPO

## 2015-10-26 ENCOUNTER — Ambulatory Visit
Admission: RE | Admit: 2015-10-26 | Discharge: 2015-10-26 | Disposition: A | Discharge status: Home / Self Care | Payer: BC Managed Care – PPO | Source: Ambulatory Visit | Attending: Family Medicine | Admitting: Family Medicine

## 2015-10-26 DIAGNOSIS — R4702 Dysphasia: Secondary | ICD-10-CM

## 2016-03-29 ENCOUNTER — Ambulatory Visit (INDEPENDENT_AMBULATORY_CARE_PROVIDER_SITE_OTHER): Payer: BC Managed Care – PPO | Admitting: Internal Medicine

## 2016-03-29 ENCOUNTER — Encounter: Payer: Self-pay | Admitting: Internal Medicine

## 2016-03-29 VITALS — BP 120/78 | HR 60 | Temp 98.2°F | Resp 12 | Wt 169.0 lb

## 2016-03-29 DIAGNOSIS — R7989 Other specified abnormal findings of blood chemistry: Secondary | ICD-10-CM

## 2016-03-29 DIAGNOSIS — R946 Abnormal results of thyroid function studies: Secondary | ICD-10-CM

## 2016-03-29 DIAGNOSIS — E042 Nontoxic multinodular goiter: Secondary | ICD-10-CM | POA: Diagnosis not present

## 2016-03-29 NOTE — Progress Notes (Addendum)
Patient ID: Sonia Adams, female   DOB: [birth year], 60 y.o.   MRN: [id number]   HPI  Sonia Adams is a 60 y.o.-year-old female, returning for follow-up for MNG. Last visit 07/2013.  Previous thyroid evaluations were reviewed and addended: In 2000, she was found to have a goiter by palpation >> sent for U/S: - Thyroid U/S (03/21/1999): right lobe larger than the left, measuring 5 x 1.4 x 2 cm, containing 2 adjacent nodules of 1.3 x 1.4 x 2.7 cm (half cystic and half solid, with some internal calcifications), and the other one at 0.8 x 0.8 x 0.9 cm (also cystic and solid). Small, 3 x 3 x 4 mm nodule in the left lobe.  - Thyroid scan (03/26/1999): multiple focal hot nodules corresponding to nodule seen on ultrasound. None appear cold. Nodules on the right are more hyperactive than those on the left. (she explained that the thyroid scan was performed because the thyroid ultrasound images were "inconclusive"?)  - Thyroid U/S (06/24/2013): The thyroid lobes measure 3.9 x 2.2 x 1.8 cm - right, 3.8 x 1.5 x 1.3 cm - left. Again, 2 adjacent nodules in the right lobe, measuring in total dimensions 2.2 x 1.6 x 1.2 cm, appears smaller than previously seen (at 2.7 cm in the largest dimension). Tiny bilateral subcentimeter nodules, with the largest measuring 7 mm. No lymphadenopathy.   - Barium swallow (10/26/2015): Mild posterior effacement of the mid cervical esophagus secondary to C6-7 ACDF. No fixed esophageal narrowing or stricture.Mild gastroesophageal reflux.  She was tx'ed for GERD and started H pylori tx, but could not tolerate it.  I reviewed pt's thyroid tests: 06/2015: TSH  Lab Results  Component Value Date   TSH 1.347 06/29/2014   TSH 1.039 06/24/2013    Pt denies feeling nodules in neck, no hoarseness, + dysphagia - ever since her cervical fusion surgery in 2006/no odynophagia, no SOB with lying down.  Pt mentions: - no heat intolerance/cold intolrance - no tremors - no  anxiety/depression - no palpitations - no fatigue - no hyperdefecation/constipation - no weight loss - no weight gain  Pt does not have a FH of thyroid ds. No FH of thyroid cancer. No h/o radiation tx to head or neck.  Since last visit, she started Ergocalciferol, stopped Ca, Mg, K, omega 3 FA.  ROS: Constitutional: please see history of present illness Eyes: Some blurry vision - needs reading glasses, no xerophthalmia ENT: no sore throat, no nodules palpated in throat, + dysphagia/odynophagia, no hoarseness Cardiovascular: no CP/SOB/palpitations/leg swelling Respiratory: no cough/SOB Gastrointestinal: no N/V/D/C Musculoskeletal: no muscle aches/joint aches Skin: no rashes Neurological: no tremors/numbness/tingling/dizziness  I reviewed pt's medications, allergies, PMH, social hx, family hx, and changes were documented in the history of present illness. Otherwise, unchanged from my initial visit note.  Past Medical History  Diagnosis Date  . Abnormal pap   . HSV-2 infection   . Menopause     Past Surgical History  Procedure Laterality Date  . Tonsillectomy  1962  . Tubal ligation  1989  . Lumbar fusion  2006   History   Social History  . Marital Status: Married    Spouse Name: N/A    Number of Children: 0   Occupational History  . 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 Smoking status: Former Smoker -- 1.00 packs/day for 20 years    Types: Cigarettes    Quit date: 06/01/2007  . Smokeless tobacco: Never Used  .  Alcohol Use: Yes     Comment: once a month  . Drug Use: No  . Sexually Active: Yes -- Female partner(s)   Current Outpatient Prescriptions on File Prior to Visit  Medication Sig Dispense Refill  . albuterol (PROVENTIL HFA;VENTOLIN HFA) 108 (90 BASE) MCG/ACT inhaler Inhale 2 puffs into the lungs 2 (two) times daily as needed for wheezing or shortness of breath.    . calcium-vitamin D (OSCAL WITH D) 500-200 MG-UNIT per tablet Take 1  tablet by mouth daily.    . fish oil-omega-3 fatty acids 1000 MG capsule Take 2 g by mouth daily.    . Magnesium 500 MG TABS Take 1 tablet by mouth daily.    . Multiple Vitamin (MULTIVITAMIN) tablet Take 1 tablet by mouth daily.    . Potassium 75 MG TABS Take 1 tablet by mouth daily.     No current facility-administered medications on file prior to visit.   Allergies  Allergen Reactions  . Levaquin [Levofloxacin In D5w] Itching and Other (See Comments)    shakiness   Family History  Problem Relation Age of Onset  . Diabetes Mother   . Alzheimer's disease Mother   . Stroke Father   . Heart disease Father    PE: BP 120/78 mmHg  Pulse 60  Temp(Src) 98.2 F (36.8 C) (Oral)  Resp 12  Wt 169 lb (76.658 kg)  SpO2 97%  LMP 01/01/2013 Body mass index is 26.46 kg/(m^2). Wt Readings from Last 3 Encounters:  03/29/16 169 lb (76.658 kg)  12/08/14 169 lb (76.658 kg)  06/29/14 159 lb (72.122 kg)   Constitutional: normal weight, in NAD Eyes: PERRLA, EOMI, no exophthalmos ENT: moist mucous membranes, +Slight symmetric thyromegaly,  no cervical lymphadenopathy Cardiovascular: RRR, No MRG Respiratory: CTA B Gastrointestinal: abdomen soft, NT, ND, BS+ Musculoskeletal: no deformities, strength intact in all 4;  Skin: moist, warm, no rashes Neurological: no tremor with outstretched hands, DTR normal in all 4  ASSESSMENT: 1. MNG  PLAN: 1. MNG  - I reviewed the images of her last thyroid ultrasound along with the patient. The dominant lesion is formed is likely by 2 adjacent nodules close together; comparing the reports from 2000 and 2014, there is no difference in the size of the nodule. I could not review the thyroid ultrasound images from 2000, only the report. I pointed out that the dominant nodule(s) are large, solid-cystic, with calcifications, without internal blood flow, and well delimited from surrounding tissue. Pt does not have a thyroid cancer family history or a personal history  of RxTx to head/neck, favoring benignity. I am still worried about the microcalcifications seen in the nodules and I discussed this with the patient. I suggested that we obtain another ultrasound now, almost 3 years since the previous, and if the nodules are still present in approximately the same size as before, I would recommend FNA. She is reticent about this, but after I explained the procedure, she is willing to go ahead with it, if needed. If the nodules are significantly smaller, we may be able to just follow them. - We discussed about the indolent nature of the thyroid cancer, however, in this case, thyroidectomy is recommended - She has had TFTs by PCP in 06/2015. We'll need to obtain these labs. She cannot remember details about them. If abnormal, she will need to repeat. If the labs are normal, I will place the order for the thyroid ultrasound. - I'll see her back in 2 years. If she develops neck  compression symptoms, we will meet sooner.  - I advised pt to contact me through my chart   04/13/2016: Received labs from PCP, drawn on 07/08/2015: TSH was suppressed, at 0.022. We will need to repeat the TSH and add free T4 and free T3 along with TSI antibodies before proceeding with further investigation for the thyroid nodule. If she is still thyrotoxic, she will probably need an uptake and scan next.  Component     Latest Ref Rng 04/19/2016  TSH     0.35 - 4.50 uIU/mL 0.70  T4,Free(Direct)     0.60 - 1.60 ng/dL 0.74  Triiodothyronine,Free,Serum     2.3 - 4.2 pg/mL 3.1  TSI     <140 % baseline 119  All normal.  Will go ahead with the thyroid U/S.  CLINICAL DATA: Multinodular goiter  EXAM: THYROID ULTRASOUND  TECHNIQUE: Ultrasound examination of the thyroid gland and adjacent soft tissues was performed.  COMPARISON: 06/24/2013  FINDINGS: Right thyroid lobe: 48 x 17 x 26 mm.  - 8 x 7 x 11 mm hypoechoic nodule without microcalcifications, inferior pole.  - 24 x 14 x 17 mm  solid nodule with central coarse calcifications, mid lobe (previously 22 x 16 x 12). Left thyroid lobe: 36 x 10 x 16 mm.  - 8 x 5 x 6 mm solid nodule, mid lobe (Previously 7 mm).  - Proximal 4 mm cyst. Isthmus Thickness: 3 mm. No nodules visualized. Lymphadenopathy: None visualized.  IMPRESSION: 1. Borderline thyromegaly with slight enlargement of dominant right nodule, which meets consensus criteria for biopsy. Ultrasound-guided fine needle aspiration should be considered, as per the consensus statement: Management of Thyroid Nodules Detected at [place]: Society of Radiologists in [place]. Radiology 2005; [id number].   Electronically Signed By: Lucrezia Europe M.D. On: 05/10/2016 17:06  Will suggest biopsy of the dominant right nodule.

## 2016-03-29 NOTE — Patient Instructions (Signed)
Please send me the results of your most recent thyroid panel.  After this, we need to order a thyroid U/S or an uptake and scan, depending on the results.   Please return in 2 years.

## 2016-04-13 ENCOUNTER — Encounter: Payer: Self-pay | Admitting: Internal Medicine

## 2016-04-13 NOTE — Addendum Note (Signed)
Addended by: Philemon Kingdom on: 04/13/2016 07:22 AM   Modules accepted: Orders

## 2016-04-17 ENCOUNTER — Other Ambulatory Visit: Payer: BC Managed Care – PPO

## 2016-04-19 ENCOUNTER — Other Ambulatory Visit (INDEPENDENT_AMBULATORY_CARE_PROVIDER_SITE_OTHER): Payer: BC Managed Care – PPO

## 2016-04-19 DIAGNOSIS — E042 Nontoxic multinodular goiter: Secondary | ICD-10-CM | POA: Diagnosis not present

## 2016-04-19 DIAGNOSIS — R946 Abnormal results of thyroid function studies: Secondary | ICD-10-CM | POA: Diagnosis not present

## 2016-04-19 DIAGNOSIS — R7989 Other specified abnormal findings of blood chemistry: Secondary | ICD-10-CM

## 2016-04-19 LAB — T3, FREE: T3 FREE: 3.1 pg/mL (ref 2.3–4.2)

## 2016-04-19 LAB — TSH: TSH: 0.7 u[IU]/mL (ref 0.35–4.50)

## 2016-04-19 LAB — T4, FREE: Free T4: 0.74 ng/dL (ref 0.60–1.60)

## 2016-04-24 LAB — THYROID STIMULATING IMMUNOGLOBULIN: TSI: 119 % baseline (ref <140)

## 2016-04-25 ENCOUNTER — Encounter: Payer: Self-pay | Admitting: Internal Medicine

## 2016-05-10 ENCOUNTER — Ambulatory Visit
Admission: RE | Admit: 2016-05-10 | Discharge: 2016-05-10 | Disposition: A | Discharge status: Home / Self Care | Payer: BC Managed Care – PPO | Source: Ambulatory Visit | Attending: Internal Medicine | Admitting: Internal Medicine

## 2017-07-16 ENCOUNTER — Other Ambulatory Visit: Payer: Self-pay | Admitting: Family Medicine

## 2017-07-16 DIAGNOSIS — Z1231 Encounter for screening mammogram for malignant neoplasm of breast: Secondary | ICD-10-CM

## 2017-07-17 ENCOUNTER — Ambulatory Visit
Admission: RE | Admit: 2017-07-17 | Discharge: 2017-07-17 | Disposition: A | Discharge status: Home / Self Care | Payer: BC Managed Care – PPO | Source: Ambulatory Visit | Attending: Family Medicine | Admitting: Family Medicine

## 2017-07-17 ENCOUNTER — Other Ambulatory Visit: Payer: Self-pay | Admitting: Family Medicine

## 2017-07-17 DIAGNOSIS — M1612 Unilateral primary osteoarthritis, left hip: Secondary | ICD-10-CM

## 2017-07-25 ENCOUNTER — Ambulatory Visit: Payer: BC Managed Care – PPO

## 2017-08-07 ENCOUNTER — Other Ambulatory Visit: Payer: Self-pay | Admitting: Family Medicine

## 2017-08-07 DIAGNOSIS — I7090 Unspecified atherosclerosis: Secondary | ICD-10-CM

## 2017-08-10 ENCOUNTER — Other Ambulatory Visit: Payer: BC Managed Care – PPO

## 2017-08-13 ENCOUNTER — Other Ambulatory Visit: Payer: BC Managed Care – PPO

## 2017-09-21 ENCOUNTER — Other Ambulatory Visit: Payer: Self-pay | Admitting: Family Medicine

## 2017-09-21 DIAGNOSIS — M79605 Pain in left leg: Secondary | ICD-10-CM

## 2017-09-21 DIAGNOSIS — M79604 Pain in right leg: Secondary | ICD-10-CM

## 2017-09-24 ENCOUNTER — Ambulatory Visit
Admission: RE | Admit: 2017-09-24 | Discharge: 2017-09-24 | Disposition: A | Discharge status: Home / Self Care | Payer: BC Managed Care – PPO | Source: Ambulatory Visit | Attending: Family Medicine | Admitting: Family Medicine

## 2017-09-24 ENCOUNTER — Inpatient Hospital Stay
Admission: RE | Admit: 2017-09-24 | Discharge: 2017-09-24 | Disposition: A | Discharge status: Home / Self Care | Payer: BC Managed Care – PPO | Source: Ambulatory Visit | Attending: Family Medicine | Admitting: Family Medicine

## 2017-09-24 DIAGNOSIS — M79604 Pain in right leg: Secondary | ICD-10-CM

## 2017-09-24 DIAGNOSIS — M79605 Pain in left leg: Secondary | ICD-10-CM

## 2017-09-28 ENCOUNTER — Ambulatory Visit
Admission: RE | Admit: 2017-09-28 | Discharge: 2017-09-28 | Disposition: A | Discharge status: Home / Self Care | Payer: BC Managed Care – PPO | Source: Ambulatory Visit | Attending: Family Medicine | Admitting: Family Medicine

## 2017-09-28 DIAGNOSIS — I7090 Unspecified atherosclerosis: Secondary | ICD-10-CM

## 2020-03-13 ENCOUNTER — Ambulatory Visit: Payer: Self-pay | Attending: Internal Medicine

## 2020-03-13 DIAGNOSIS — Z23 Encounter for immunization: Secondary | ICD-10-CM

## 2020-03-13 NOTE — Progress Notes (Signed)
   Covid-19 Vaccination Clinic  Name:  Sonia Adams    MRN: [id number] DOB: December 01, 1956  03/13/2020  [name] was observed post Covid-19 immunization for 15 minutes without incident. She was provided with Vaccine Information Sheet and instruction to access the V-Safe system.   [name] was instructed to call 911 with any severe reactions post vaccine: Marland Kitchen Difficulty breathing  . Swelling of face and throat  . A fast heartbeat  . A bad rash all over body  . Dizziness and weakness   Immunizations Administered    Name Date Dose VIS Date Route   Pfizer COVID-19 Vaccine 03/13/2020  3:13 PM 0.3 mL 11/28/2019 Intramuscular   Manufacturer: [employer], [employer]   Lot: [id number]   [place]: [id number]

## 2020-04-07 ENCOUNTER — Ambulatory Visit: Payer: Self-pay | Attending: Internal Medicine

## 2020-04-07 DIAGNOSIS — Z23 Encounter for immunization: Secondary | ICD-10-CM

## 2020-04-07 NOTE — Progress Notes (Signed)
   Covid-19 Vaccination Clinic  Name:  Sonia Adams    MRN: [id number] DOB: 08/14/1956  04/07/2020  Sonia Adams was observed post Covid-19 immunization for 15 minutes without incident. She was provided with Vaccine Information Sheet and instruction to access the V-Safe system.   Sonia Adams was instructed to call 911 with any severe reactions post vaccine: Marland Kitchen Difficulty breathing  . Swelling of face and throat  . A fast heartbeat  . A bad rash all over body  . Dizziness and weakness   Immunizations Administered    Name Date Dose VIS Date Route   Pfizer COVID-19 Vaccine 04/07/2020  8:32 AM 0.3 mL 02/11/2019 Intramuscular   Manufacturer: [employer]   Lot: [id number]   [place]: [id number]

## 2020-12-31 ENCOUNTER — Other Ambulatory Visit: Payer: Self-pay | Admitting: Family Medicine

## 2020-12-31 DIAGNOSIS — E042 Nontoxic multinodular goiter: Secondary | ICD-10-CM

## 2021-02-18 ENCOUNTER — Ambulatory Visit
Admission: RE | Admit: 2021-02-18 | Discharge: 2021-02-18 | Disposition: A | Discharge status: Home / Self Care | Payer: BC Managed Care – PPO | Source: Ambulatory Visit | Attending: Family Medicine | Admitting: Family Medicine

## 2021-02-18 ENCOUNTER — Other Ambulatory Visit: Payer: Self-pay

## 2021-02-18 DIAGNOSIS — E042 Nontoxic multinodular goiter: Secondary | ICD-10-CM

## 2021-04-12 ENCOUNTER — Ambulatory Visit: Payer: BC Managed Care – PPO | Admitting: Internal Medicine

## 2021-04-12 ENCOUNTER — Encounter: Payer: Self-pay | Admitting: Internal Medicine

## 2021-04-12 ENCOUNTER — Other Ambulatory Visit: Payer: Self-pay

## 2021-04-12 VITALS — BP 120/80 | HR 81 | Ht 67.0 in | Wt 149.0 lb

## 2021-04-12 DIAGNOSIS — E042 Nontoxic multinodular goiter: Secondary | ICD-10-CM | POA: Diagnosis not present

## 2021-04-12 NOTE — Patient Instructions (Addendum)
We will check a biopsy of the R thyroid nodule.  Please come back for a follow-up appointment in 1 year.   Thyroid Needle Biopsy  Thyroid needle biopsy is a procedure to remove small samples of tissue or fluid from the thyroid gland. The samples are then examined under a microscope. The thyroid is a gland in the lower front area of the neck. It produces hormones that affect many important body processes, including growth and development, body temperature, and how the body uses food for energy (metabolism). This procedure is often done to help diagnose cancer, infection, or other problems with the thyroid. During this procedure, a thin needle (fine needle) is inserted through the skin and into the thyroid gland. This is less invasive than a procedure in which an incision is made over the thyroid (open thyroid biopsy). Sometimes, an open thyroid biopsy may be done during a different surgery, such as surgery to remove a part or a whole section (lobe) of the thyroid gland (open lobectomy). Tell a health care provider about:  Any allergies you have.  All medicines you are taking, including vitamins, herbs, eye drops, creams, and over-the-counter medicines.  Any problems you or family members have had with anesthetic medicines.  Any blood disorders you have.  Any surgeries you have had.  Any medical conditions you have.  Whether you are pregnant or may be pregnant. What are the risks? Generally, this is a safe procedure. However, problems may occur, including:  Infection.  Bleeding.  Allergic reactions to medicines.  Damage to nerves or blood vessels in the neck. What happens before the procedure? Medicines  Ask your health care provider about: ? Changing or stopping your regular medicines. This is especially important if you are taking diabetes medicines or blood thinners. ? Taking medicines such as aspirin and ibuprofen. These medicines can thin your blood. Do not take these  medicines unless your health care provider tells you to take them. ? Taking over-the-counter medicines, vitamins, herbs, and supplements. General instructions  You may have blood tests.  You may have an ultrasound before or during the needle biopsy. What happens during the procedure?  You will be asked to lie on your back with your head tipped backward to extend your neck. You may be asked to avoid coughing, talking, swallowing, or making sounds during some parts of the procedure.  To lower your risk of infection: ? Your health care team will wash or sanitize their hands. ? The skin over your thyroid will be cleaned with a germ-killing (antiseptic) solution.  A local anesthetic (lidocaine) may be injected into the skin over your thyroid, to numb the area.  An ultrasound may be done to help guide the needle to the desired area of your thyroid.  A fine needle will be inserted into your thyroid. The needle will be used to remove tissue or fluid samples as needed. The samples will be sent to a lab for examination.  The needle will be removed.  Pressure may be applied to your neck to reduce swelling and stop bleeding. The procedure may vary among health care providers and hospitals. What happens after the procedure?  It is up to you to get the results of your procedure. Ask your health care provider, or the department that is doing the procedure, when your results will be ready. Summary  Thyroid needle biopsy is a procedure to remove small samples of tissue or fluid from the thyroid gland.  During this procedure, a thin needle (  fine needle) is inserted through the skin and into the thyroid gland. This is less invasive than a procedure in which an incision is made over the thyroid (open thyroid biopsy).  You will be asked to lie on your back with your head tipped backward to extend your neck. You may be asked to avoid coughing, talking, swallowing, or making sounds during some parts of  the procedure. This information is not intended to replace advice given to you by your health care provider. Make sure you discuss any questions you have with your health care provider. Document Revised: 08/12/2020 Document Reviewed: 08/12/2020 Elsevier Patient Education  [place].

## 2021-04-12 NOTE — Progress Notes (Addendum)
Blood patient ID: Sonia Adams, female   DOB: 08-18-1956, 65 y.o.   MRN: [id number]  This visit occurred during the SARS-CoV-2 public health emergency.  Safety protocols were in place, including screening questions prior to the visit, additional usage of staff PPE, and extensive cleaning of exam room while observing appropriate contact time as indicated for disinfecting solutions.   HPI  Sonia Adams is a 65 y.o.-year-old female, returning for follow-up for MNG. Last visit 5 years ago.  Interim history: Patient has been doing well since last visit, but she does feel discomfort in her neck when swallowing, and occasional cough.  She has to chew her food very well before swallowing to avoid coughing.  Reviewed and addended previous investigation: In 2000, she was found to have a goiter by palpation >> sent for U/S: - Thyroid U/S (03/21/1999): right lobe larger than the left, measuring 5 x 1.4 x 2 cm, containing 2 adjacent nodules of 1.3 x 1.4 x 2.7 cm (half cystic and half solid, with some internal calcifications), and the other one at 0.8 x 0.8 x 0.9 cm (also cystic and solid). Small, 3 x 3 x 4 mm nodule in the left lobe.  - Thyroid scan (03/26/1999): multiple focal hot nodules corresponding to nodule seen on ultrasound. None appear cold. Nodules on the right are more hyperactive than those on the left. (she explained that the thyroid scan was performed because the thyroid ultrasound images were "inconclusive"?)  - Thyroid U/S (06/24/2013): The thyroid lobes measure 3.9 x 2.2 x 1.8 cm - right, 3.8 x 1.5 x 1.3 cm - left. Again, 2 adjacent nodules in the right lobe, measuring in total dimensions 2.2 x 1.6 x 1.2 cm, appears smaller than previously seen (at 2.7 cm in the largest dimension). Tiny bilateral subcentimeter nodules, with the largest measuring 7 mm. No lymphadenopathy.   - Barium swallow (10/26/2015): Mild posterior effacement of the mid cervical esophagus secondary to C6-7 ACDF. No  fixed esophageal narrowing or stricture.Mild gastroesophageal reflux.  - Thyroid U/S (05/10/2016): Right thyroid lobe: 48 x 17 x 26 mm.  - 8 x 7 x 11 mm hypoechoic nodule without microcalcifications, inferior pole.  - 24 x 14 x 17 mm solid nodule with central coarse calcifications, mid lobe (previously 22 x 16 x 12) Left thyroid lobe: 36 x 10 x 16 mm.  - 8 x 5 x 6 mm solid nodule, mid lobe (Previously 7 mm).  - Proximal 4 mm cyst. Isthmus Thickness: 3 mm. No nodules visualized. Lymphadenopathy: None visualized.  IMPRESSION: 1. Borderline thyromegaly with slight enlargement of dominant right nodule, which meets consensus criteria for biopsy. Ultrasound-guided fine needle aspiration should be considered.  I suggested biopsy of the right thyroid nodule.  However, she was lost for follow-up for me afterwards.  A new thyroid ultrasound was obtained by PCP: - Thyroid U/S (02/18/2021): Parenchymal Echotexture: Mildly heterogenous Isthmus: 0.2 cm, previously 0.3 cm Right lobe: 5.3 x 1.8 x 2.8 cm, previously 4.8 x 1.7 x 2.6 cm Left lobe: 3.5 x 1.2 x 1.5 cm, previously 3.6 x 1.0 x 1.6 cm _________________________________________________________  Nodule # 1: Prior biopsy: No Location: Right; Mid Maximum size: 2.2 cm; Other 2 dimensions: 1.3 x 1.6 cm, previously, 2.4 x 1.4 x 1.7 cm Composition: solid/almost completely solid (2) Echogenicity: hypoechoic (2) Echogenic foci: macrocalcifications (1) **Given size (>/= 1.5 cm) and appearance, fine needle aspiration of this moderately suspicious nodule should be considered based on TI-RADS criteria. However, 5 years of stability  implies benignity. _________________________________________________________  Nodule # 2: Prior biopsy: No Location: Right; Inferior Maximum size: 1.0 cm; Other 2 dimensions: 0.8 x 0.8 cm, previously, 1.1 x 0.8 x 0.7 cm Composition: solid/almost completely solid (2) Echogenicity: hypoechoic (2) *Given size (>/= 1  - 1.4 cm) and appearance, a follow-up ultrasound in 1 year should be considered based on TI-RADS criteria. However, this nodule is now stable 5 years which confers benignity.  _________________________________________________________  Nodule # 3: Prior biopsy: No Location: Left; Mid Maximum size: 1.0 cm; Other 2 dimensions: 0.4 x 0.7 cm, previously, 0.8 x 0.5 x 0.6 cm Composition: solid/almost completely solid (2) Echogenicity: hypoechoic (2) *Given size (>/= 1 - 1.4 cm) and appearance, a follow-up ultrasound in 1 year should be considered based on TI-RADS criteria. However, 5 years of stability implies benignity. _________________________________________________________  IMPRESSION: 1. Unchanged dominant right solid thyroid nodule (labeled 1, 2.2 cm, TI-RADS category 5) which technically meets criteria for ultrasound-guided biopsy, however 5 years of stability is suggestive of benignity. Tissue sampling, 1 year ultrasound follow-up, or no further observation would each be reasonable follow-up considerations. 2. Similar appearing right inferior and left mid thyroid nodules which after 5 years of stability are declared benign and do not warrant additional ultrasound follow-up or tissue sampling.  I reviewed her TFTs: 09/2020: normal TSH reportedly Lab Results  Component Value Date   TSH 0.70 04/19/2016   TSH 1.347 06/29/2014   TSH 1.039 06/24/2013   FREET4 0.74 04/19/2016    Pt denies feeling nodules in neck, no hoarseness, + dysphagia - ever since her cervical fusion surgery in 2006/no odynophagia, no SOB with lying down. Discomfort when lying down and also cough.  She has a history of GERD and H. pylori infection.  No FH of thyroid cancer. No h/o radiation tx to head or neck.  No seaweed or kelp. No recent contrast studies. No herbal supplements. No Biotin use. No recent steroids use.   ROS: Constitutional: no weight gain/no weight loss, no fatigue, no subjective  hyperthermia, no subjective hypothermia Eyes: no blurry vision, no xerophthalmia ENT: no sore throat, + see HPI, + seasonal allergies Cardiovascular: no CP/no SOB/no palpitations/no leg swelling Respiratory: no cough/no SOB/no wheezing Gastrointestinal: no N/no V/no D/no C/+ occasional acid reflux Musculoskeletal: no muscle aches/no joint aches Skin: + rash on forearms - on Clobethasol, no hair loss, + splitting nails Neurological: no tremors/no numbness/no tingling/no dizziness  I reviewed pt's medications, allergies, PMH, social hx, family hx, and changes were documented in the history of present illness. Otherwise, unchanged from my initial visit note.  Past Medical History:  Diagnosis Date  . Abnormal pap   . HSV-2 infection   . Menopause     Past Surgical History:  Procedure Laterality Date  . LUMBAR FUSION  2006  . TONSILLECTOMY  1962  . TUBAL LIGATION  1989   History   Social History  . Marital Status: Married    Spouse Name: N/A    Number of Children: 0   Occupational History  . Paralegal   Social History Main Topics  . Smoking status: Former Smoker --1 pack a day for 20 years    Types: Cigarettes    Quit date: 06/01/2007  . Smokeless tobacco: Never Used  . Alcohol Use:  Yes-socially       . Drug Use: No  . Sexually Active: Yes -- Female partner(s)   Current Outpatient Medications on File Prior to Visit  Medication Sig Dispense Refill  . albuterol (PROVENTIL  HFA;VENTOLIN HFA) 108 (90 BASE) MCG/ACT inhaler Inhale 2 puffs into the lungs as needed for wheezing or shortness of breath.     Marland Kitchen azelastine (ASTELIN) 0.1 % nasal spray Place 2 sprays into both nostrils 2 (two) times daily. Use in each nostril as directed    . calcium-vitamin D (OSCAL WITH D) 500-200 MG-UNIT per tablet Take 1 tablet by mouth daily.    . ergocalciferol (VITAMIN D2) 50000 units capsule Take 50,000 Units by mouth once a week.    . fish oil-omega-3 fatty acids 1000 MG capsule Take 2 g by  mouth daily.    Marland Kitchen loratadine (CLARITIN) 10 MG tablet Take 10 mg by mouth daily.    . Magnesium 500 MG TABS Take 1 tablet by mouth daily.    . Multiple Vitamin (MULTIVITAMIN) tablet Take 1 tablet by mouth daily.    . Potassium 75 MG TABS Take 1 tablet by mouth daily.     No current facility-administered medications on file prior to visit.   Allergies  Allergen Reactions  . Levaquin [Levofloxacin In D5w] Itching and Other (See Comments)    shakiness   Family History  Problem Relation Age of Onset  . Diabetes Mother   . Alzheimer's disease Mother   . Stroke Father   . Heart disease Father    PE: BP 120/80 (BP Location: Left Arm, Patient Position: Sitting, Cuff Size: Normal)   Pulse 81   Ht 5\' 7"  (1.702 m)   Wt 149 lb (67.6 kg)   LMP 01/01/2013   SpO2 98%   BMI 23.34 kg/m  Body mass index is 23.34 kg/m. Wt Readings from Last 3 Encounters:  04/12/21 149 lb (67.6 kg)  03/29/16 169 lb (76.7 kg)  12/08/14 169 lb (76.7 kg)   Constitutional: normal weight, in NAD Eyes: PERRLA, EOMI, no exophthalmos ENT: moist mucous membranes, + slight symmetric thyromegaly, no cervical lymphadenopathy Cardiovascular: RRR, No MRG Respiratory: CTA B Gastrointestinal: abdomen soft, NT, ND, BS+ Musculoskeletal: no deformities, strength intact in all 4 Skin: moist, warm, no rash observed on forearms Neurological: no tremor with outstretched hands, DTR normal in all 4  ASSESSMENT: 1. MNG  PLAN: 1. MNG  -I reviewed the report and images of her latest thyroid ultrasound from last month along with the patient.  The largest thyroid nodule is in the right thyroid lobe, likely formed by 2 adjacent nodules.  Compared to the ultrasound from 2014, there is no difference in size of the nodule.  I could not review the thyroid ultrasound images from 2000, only the report.  The dominant nodule is large, solid-cystic, with some internal blood flow and well delimited from surrounding tissue.  It does contain  microcalcifications, which confers a higher risk, though.  She does not have a family history of thyroid cancer or personal history of radiation therapy to head or neck, favoring benignity. The right thyroid nodule is still large, and despite the fact that it is stable in size, and the fact that it was possibly hot on her thyroid scan from 2000, I still suggest biopsy.  If this returns benign, no further investigation is needed, however, she does complain of some neck compression symptoms: Dysphagia and cough when swallowing.  These are fairly bothersome for her. -We discussed that if the nodule turns out to be cancerous, I would suggest total thyroidectomy. However, if the nodule is benign, I would suggest to repeat her barium swallow.  She had one checked in 2016 and this did not  show compression from the thyroid on the esophagus. -She agrees with the plan -Will not repeat a TSH and this was reportedly normal last year by PCP -I will see her back in a year.  Orders Placed This Encounter  Procedures  . [place] FNA BX THYROID 1ST LESION AFIRMA   FNA (04/20/2021):  Clinical History: Right mid 2.2 cm; Other 2 dimensions: 1.3 x 1.6 cm,  previously, 2.4 x 1.4 x 1.7 cm, Solid/almost completely solid,  Hypoechoic, TI-RADS total points: 5  Specimen Submitted: A. THYROID, RMP, FINE NEEDLE ASPIRATION:    FINAL MICROSCOPIC DIAGNOSIS:  - Consistent with benign follicular nodule (Bethesda category II)   SPECIMEN ADEQUACY:  Satisfactory for evaluation   Philemon Kingdom, MD PhD [facility]

## 2021-04-20 ENCOUNTER — Ambulatory Visit
Admission: RE | Admit: 2021-04-20 | Discharge: 2021-04-20 | Disposition: A | Discharge status: Home / Self Care | Payer: BC Managed Care – PPO | Source: Ambulatory Visit | Attending: Internal Medicine | Admitting: Internal Medicine

## 2021-04-20 ENCOUNTER — Other Ambulatory Visit (HOSPITAL_COMMUNITY)
Admission: RE | Admit: 2021-04-20 | Discharge: 2021-04-20 | Disposition: A | Discharge status: Home / Self Care | Payer: BC Managed Care – PPO | Source: Ambulatory Visit | Attending: Internal Medicine | Admitting: Internal Medicine

## 2021-04-20 DIAGNOSIS — D34 Benign neoplasm of thyroid gland: Secondary | ICD-10-CM | POA: Insufficient documentation

## 2021-04-20 DIAGNOSIS — E042 Nontoxic multinodular goiter: Secondary | ICD-10-CM

## 2021-04-22 LAB — CYTOLOGY - NON PAP

## 2021-04-22 NOTE — Addendum Note (Signed)
Addended by: Philemon Kingdom on: 04/22/2021 04:58 PM   Modules accepted: Orders

## 2021-07-11 IMAGING — US US THYROID
1 series · 12 of 25 positions shown · non-contrast
Comparison: 05/10/2016

CLINICAL DATA: Prior ultrasound follow-up.

EXAM:
THYROID ULTRASOUND
TECHNIQUE: Ultrasound examination of the thyroid gland and adjacent soft
tissues was performed.

[Series 1: us thyroid · 0.04mm/px · 12 of 102 slices shown]
[im 5/102]
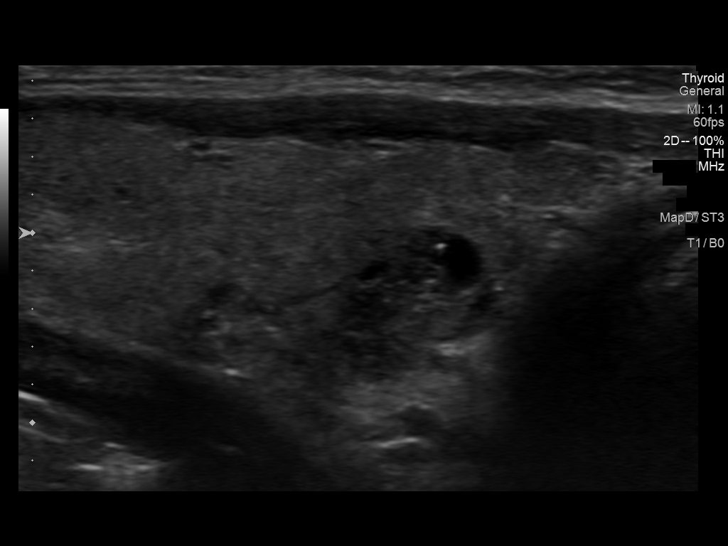
[im 13/102]
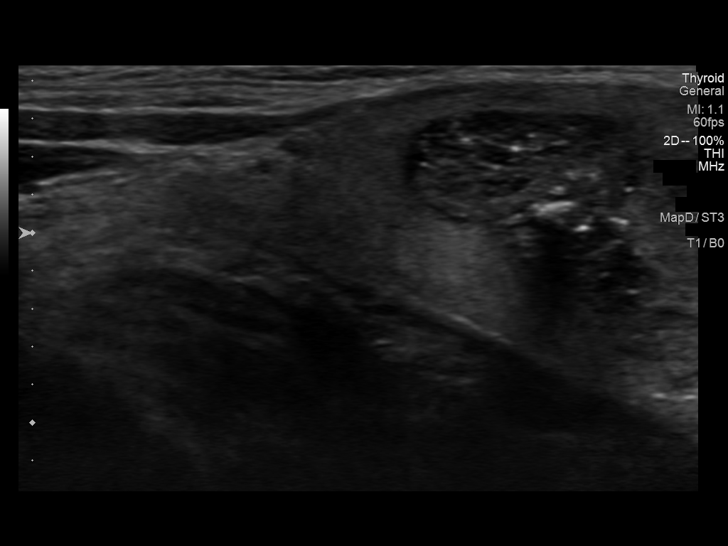
[im 22/102]
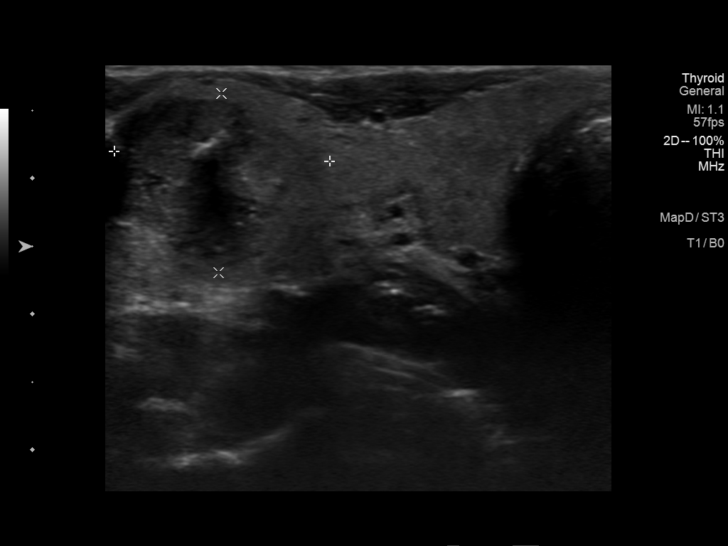
[im 30/102]
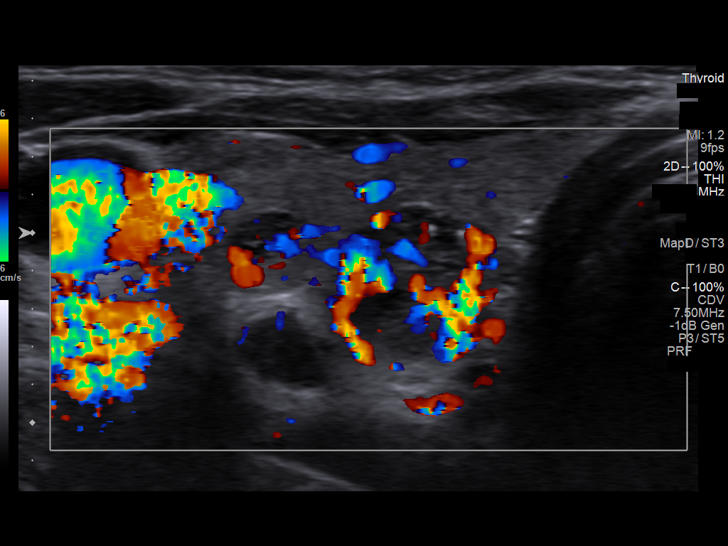
[im 38/102]
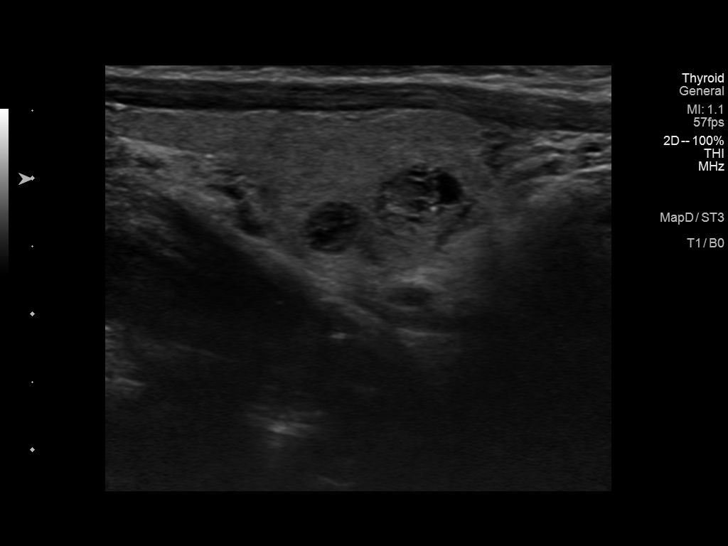
[im 47/102]
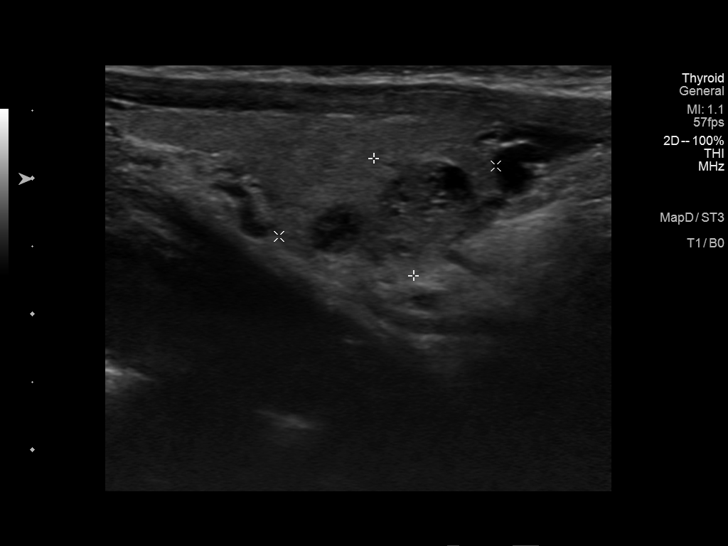
[im 55/102]
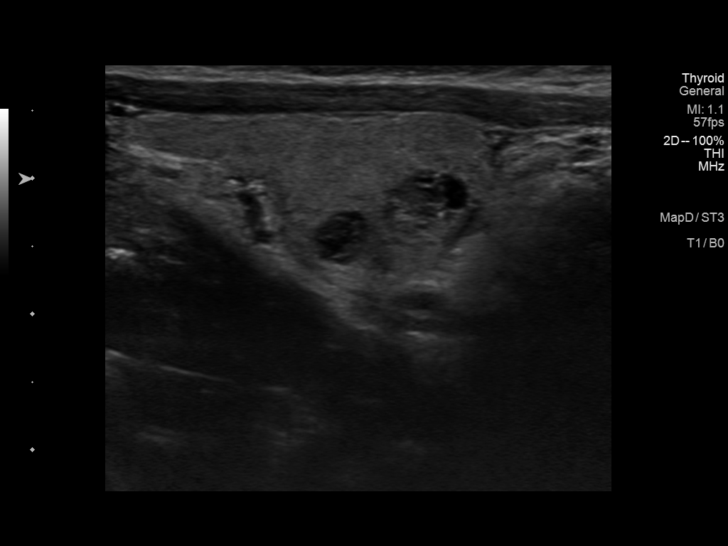
[im 64/102]
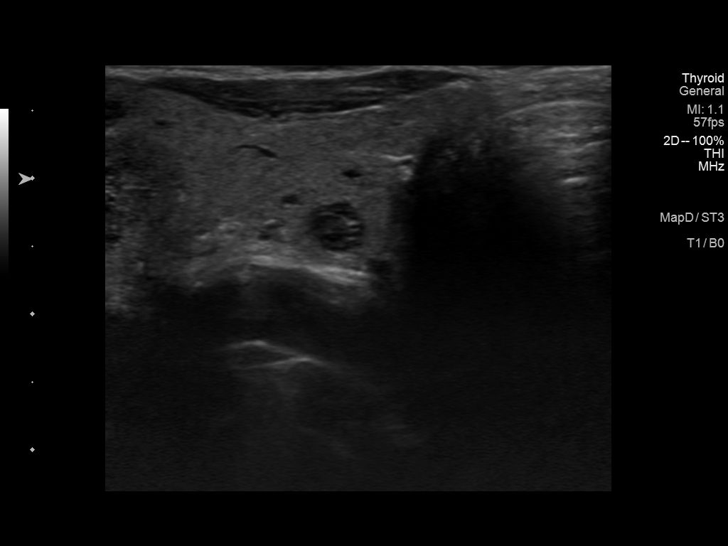
[im 72/102]
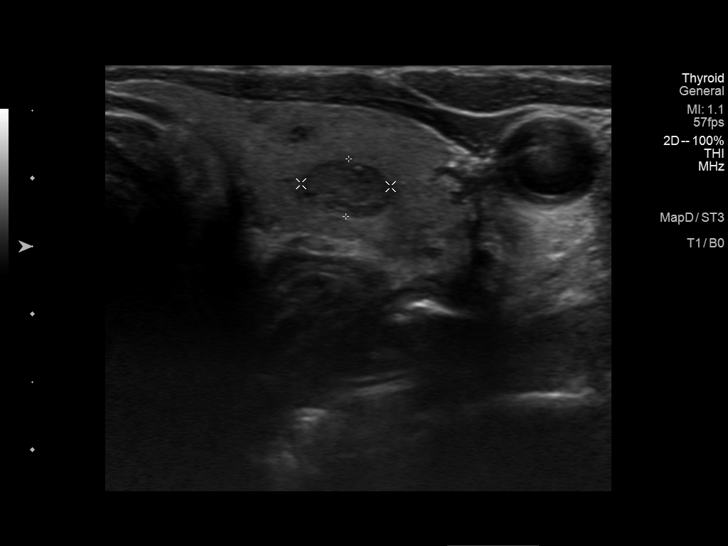
[im 80/102]
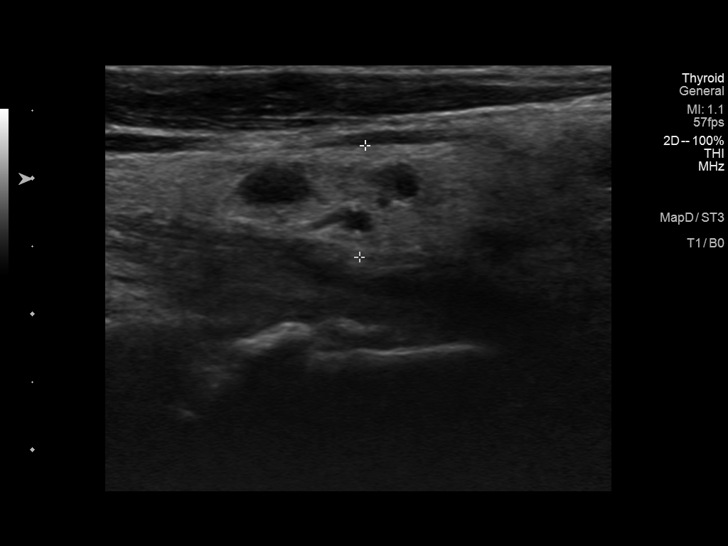
[im 89/102]
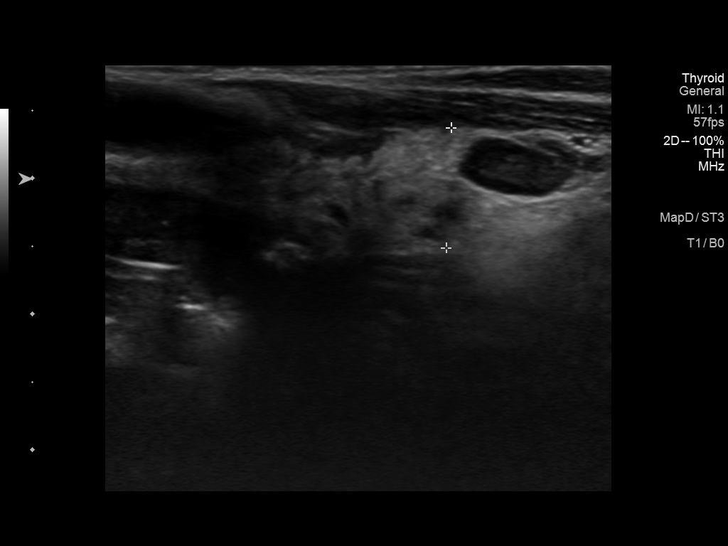
[im 97/102]
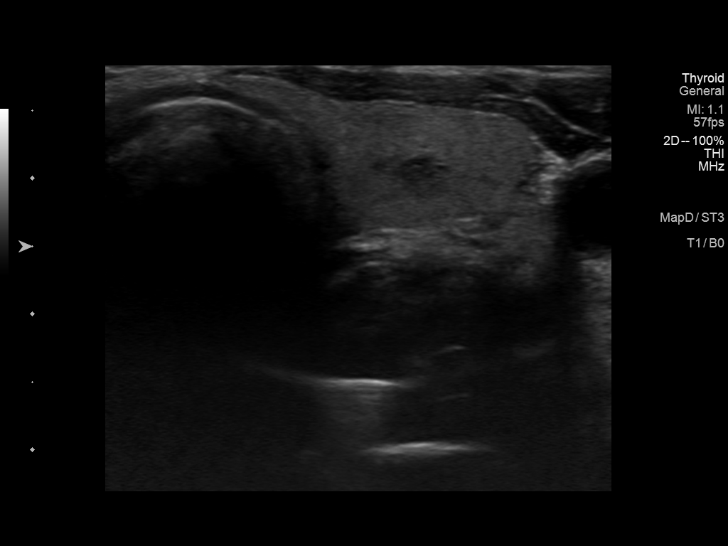

[12 of 25 positions shown; findings below may reference images not displayed]

FINDINGS: Parenchymal Echotexture: Mildly heterogenous

Isthmus: 0.2 cm, previously 0.3 cm

Right lobe: 5.3 x 1.8 x 2.8 cm, previously 4.8 x 1.7 x 2.6 cm

Left lobe: 3.5 x 1.2 x 1.5 cm, previously 3.6 x 1.0 x 1.6 cm

_________________________________________________________

Estimated total number of nodules >/= 1 cm: 3

Number of spongiform nodules >/=  2 cm not described below (TR1): 0

Number of mixed cystic and solid nodules >/= 1.5 cm not described
below (TR2): 0

_________________________________________________________

Nodule # 1:

Prior biopsy: No

Location: Right; Mid

Maximum size: 2.2 cm; Other 2 dimensions: 1.3 x 1.6 cm, previously,
2.4 x 1.4 x 1.7 cm

Composition: solid/almost completely solid (2)

Echogenicity: hypoechoic (2)

Shape: not taller-than-wide (0)

Margins: smooth (0)

Echogenic foci: macrocalcifications (1)

ACR TI-RADS total points: 5.

ACR TI-RADS risk category:  TR5 (>/= 7 points).

Significant change in size (>/= 20% in two dimensions and minimal
increase of 2 mm): No

Change in features: No

Change in ACR TI-RADS risk category: No

ACR TI-RADS recommendations:

**Given size (>/= 1.5 cm) and appearance, fine needle aspiration of
this moderately suspicious nodule should be considered based on
TI-RADS criteria. However, 5 years of stability implies benignity.

_________________________________________________________

Nodule # 2:

Prior biopsy: No

Location: Right; Inferior

Maximum size: 1.0 cm; Other 2 dimensions: 0.8 x 0.8 cm, previously,
1.1 x 0.8 x 0.7 cm

Composition: solid/almost completely solid (2)

Echogenicity: hypoechoic (2)

Shape: not taller-than-wide (0)

Margins: smooth (0)

Echogenic foci: none (0)

ACR TI-RADS total points: 4.

ACR TI-RADS risk category:  TR4 (4-6 points).

Significant change in size (>/= 20% in two dimensions and minimal
increase of 2 mm): No

Change in features: No

Change in ACR TI-RADS risk category: No

ACR TI-RADS recommendations:

*Given size (>/= 1 - 1.4 cm) and appearance, a follow-up ultrasound
in 1 year should be considered based on TI-RADS criteria. However,
this nodule is now stable 5 years which confers benignity.

_________________________________________________________

Nodule # 3:

Prior biopsy: No

Location: Left; Mid

Maximum size: 1.0 cm; Other 2 dimensions: 0.4 x 0.7 cm, previously,
0.8 x 0.5 x 0.6 cm

Composition: solid/almost completely solid (2)

Echogenicity: hypoechoic (2)

Shape: not taller-than-wide (0)

Margins: smooth (0)

Echogenic foci: none (0)

ACR TI-RADS total points: 4.

ACR TI-RADS risk category:  TR4 (4-6 points).

Significant change in size (>/= 20% in two dimensions and minimal
increase of 2 mm): No

Change in features: No

Change in ACR TI-RADS risk category: No

ACR TI-RADS recommendations:

*Given size (>/= 1 - 1.4 cm) and appearance, a follow-up ultrasound
in 1 year should be considered based on TI-RADS criteria. However, 5
years of stability implies benignity.

_________________________________________________________
IMPRESSION: 1. Unchanged dominant right solid thyroid nodule (labeled 1, 2.2 cm,
TI-RADS category 5) which technically meets criteria for
ultrasound-guided biopsy, however 5 years of stability is suggestive
of benignity. Tissue sampling, 1 year ultrasound follow-up, or no
further observation would each be reasonable follow-up
considerations.
2. Similar appearing right inferior and left mid thyroid nodules
which after 5 years of stability are declared benign and do not
warrant additional ultrasound follow-up or tissue sampling.

The above is in keeping with the ACR TI-RADS recommendations - [HOSPITAL] 3092;[DATE].

## 2021-09-10 IMAGING — US US FNA BIOPSY THYROID 1ST LESION
1 series · 13 of 19 positions shown · non-contrast
Comparison: US Thyroid 02/18/21

MEDICATIONS:
10 cc 1% lidocaine

COMPLICATIONS:
None immediate.

INDICATION: Indeterminate thyroid nodule

Right mid lobe thyroid nodule
2.2 cm
EXAM:
ULTRASOUND GUIDED FINE NEEDLE ASPIRATION OF INDETERMINATE THYROID
NODULE
TECHNIQUE: Informed written consent was obtained from the patient after a
discussion of the risks, benefits and alternatives to treatment.
Questions regarding the procedure were encouraged and answered. A
timeout was performed prior to the initiation of the procedure.

[Series 1: us fna biopsy thyroid 1st lesion · 0.04mm/px · 19 acquisitions, 13 frames shown]
[im 1/19]
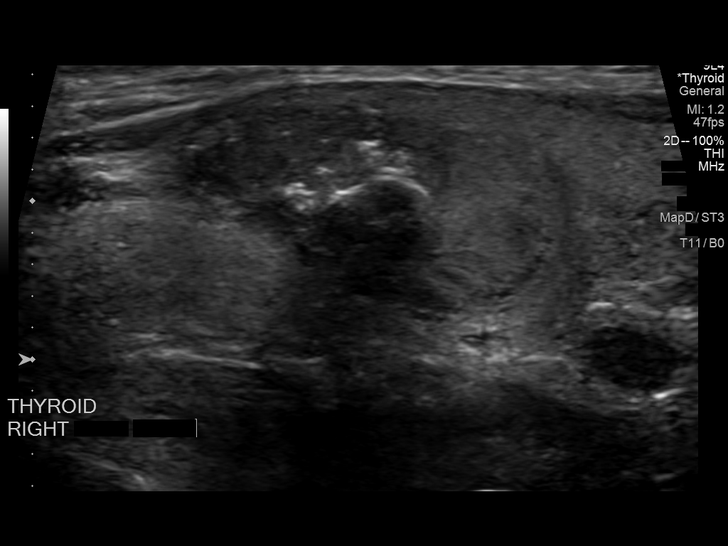
[im 3/19]
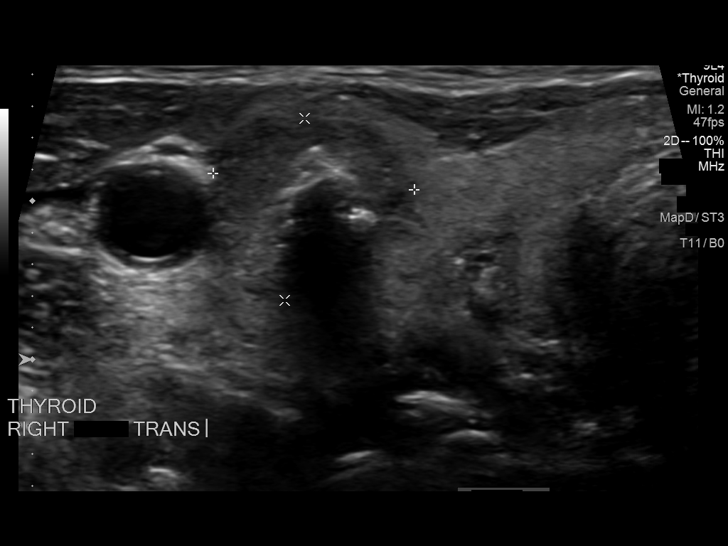
[im 4/19]
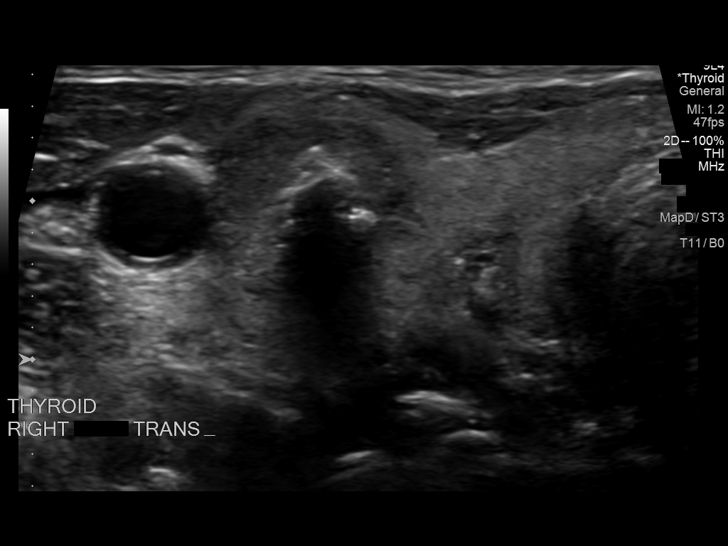
[im 6/19]
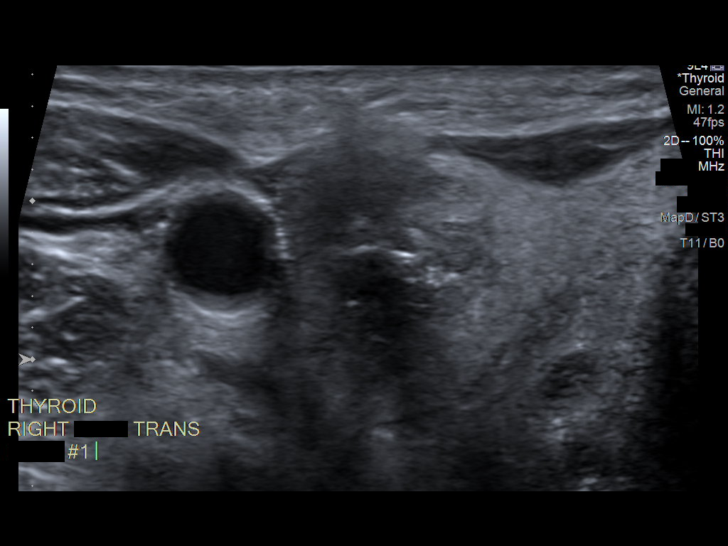
[im 7/19]
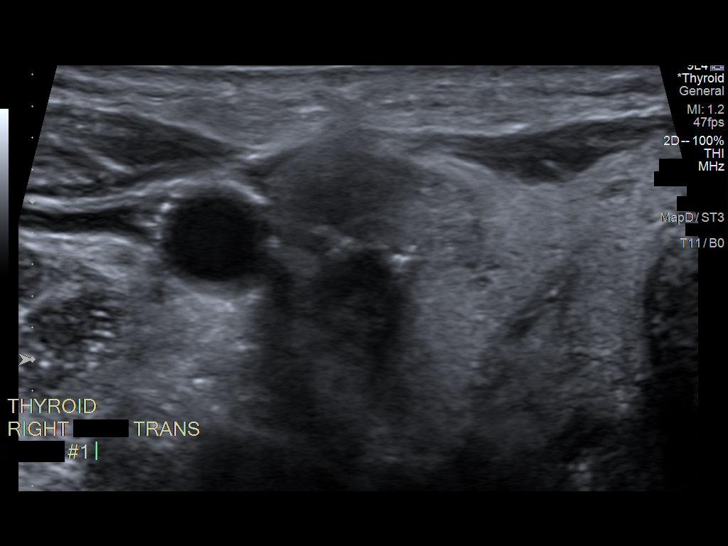
[im 9/19]
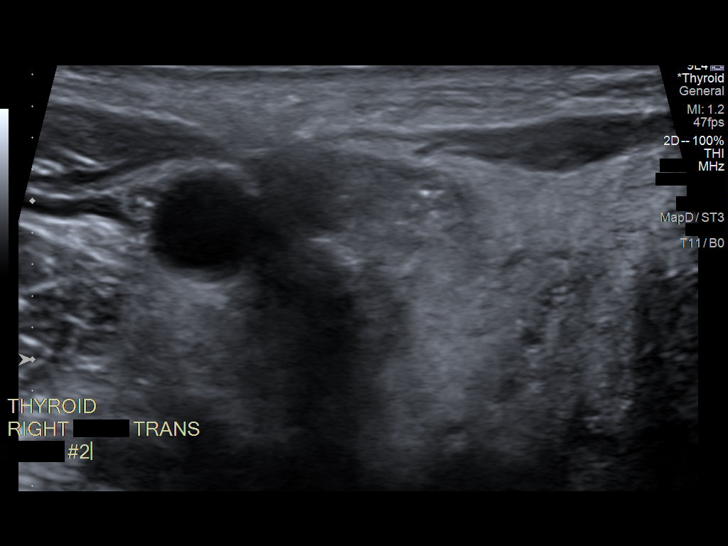
[im 10/19]
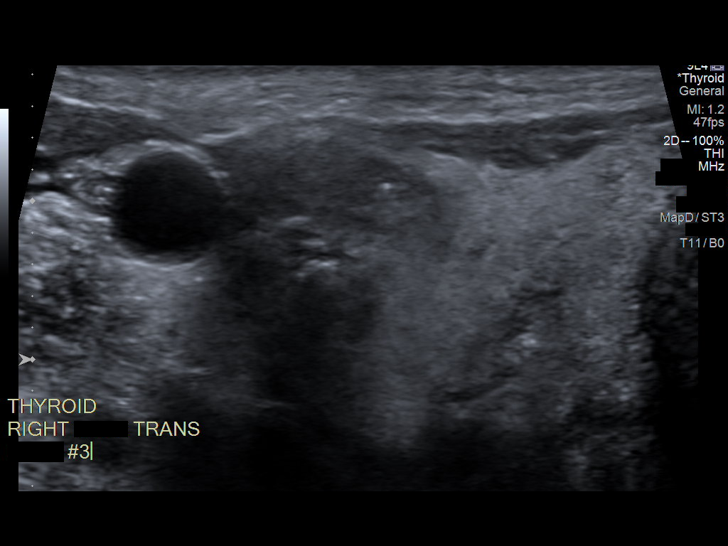
[im 11/19]
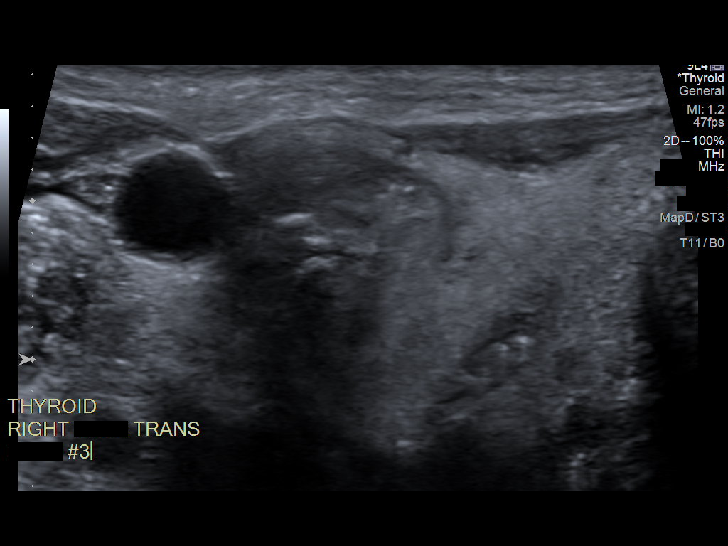
[im 13/19]
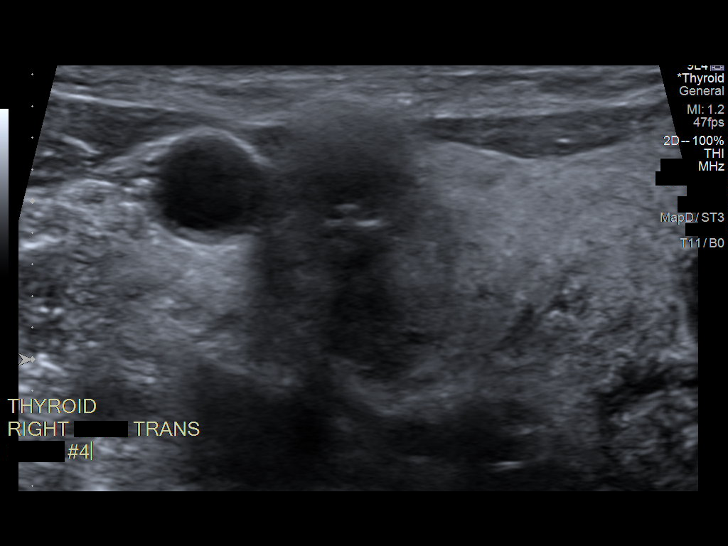
[im 14/19]
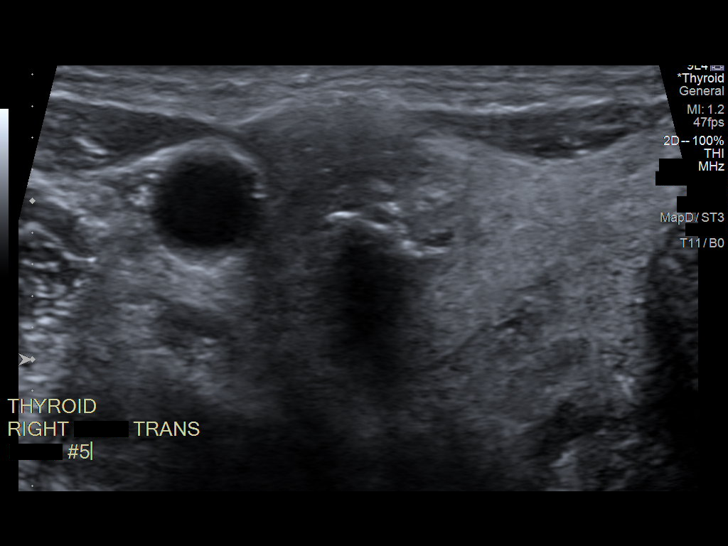
[im 16/19]
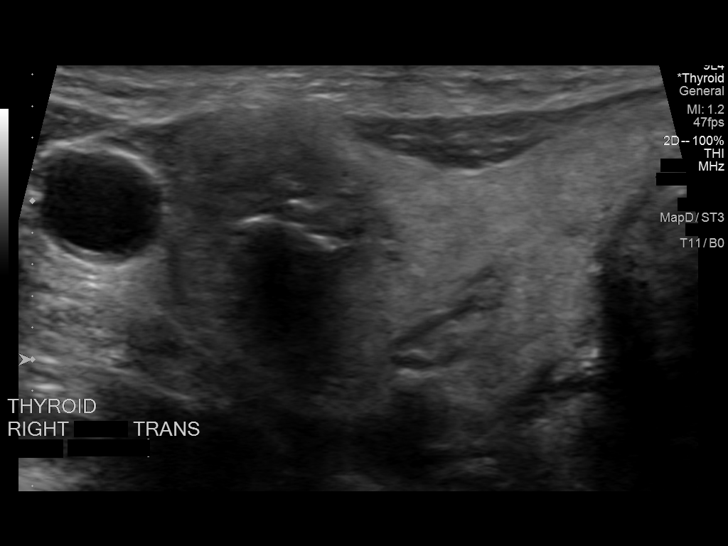
[im 17/19]
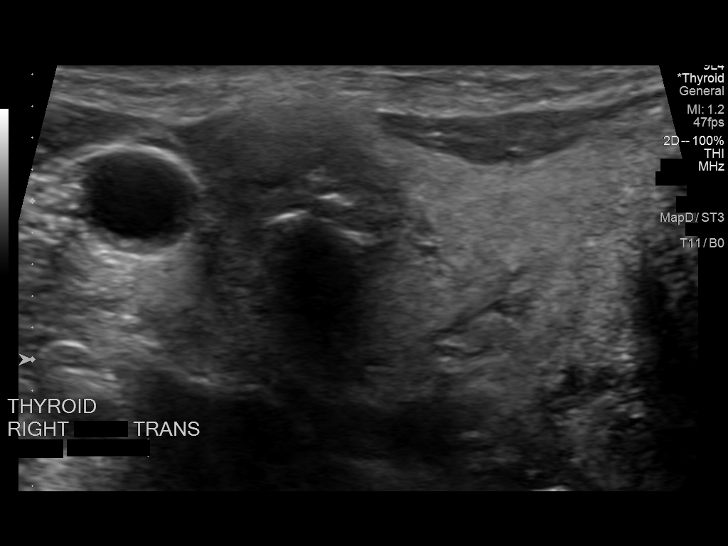
[im 19/19]
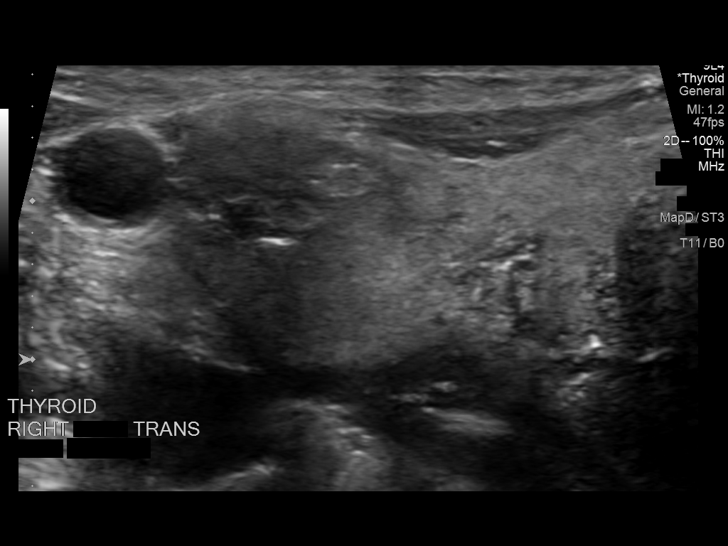

[13 of 19 positions shown; findings below may reference images not displayed]

Pre-procedural ultrasound scanning demonstrated unchanged size and
appearance of the indeterminate nodule within the right thyroid

The procedure was planned. The neck was prepped in the usual sterile
fashion, and a sterile drape was applied covering the operative
field. A timeout was performed prior to the initiation of the
procedure. Local anesthesia was provided with 1% lidocaine.

Under direct ultrasound guidance, 5 FNA biopsies were performed of
the right mid lobe thyroid nodule with a 25 gauge needle.

2 of these samples were obtained for AFIRMA.

Multiple ultrasound images were saved for procedural documentation
purposes. The samples were prepared and submitted to pathology.

Limited post procedural scanning was negative for hematoma or
additional complication. Dressings were placed. The patient
tolerated the above procedures procedure well without immediate
postprocedural complication.
FINDINGS: Nodule reference number based on prior diagnostic ultrasound: 1

Maximum size: 2.2 cm

Location: Right; Mid

ACR TI-RADS risk category: TR5 (>/= 7 points)

Reason for biopsy: meets ACR TI-RADS criteria

Ultrasound imaging confirms appropriate placement of the needles
within the thyroid nodule.
IMPRESSION: Technically successful ultrasound guided fine needle aspiration of
right mid lobe thyroid nodule

Read by

Adiela Isler

## 2021-12-23 LAB — COLOGUARD: COLOGUARD: POSITIVE — AB

## 2022-01-09 ENCOUNTER — Encounter: Payer: Self-pay | Admitting: Gastroenterology

## 2022-02-08 ENCOUNTER — Other Ambulatory Visit: Payer: Self-pay

## 2022-02-08 ENCOUNTER — Ambulatory Visit (AMBULATORY_SURGERY_CENTER): Payer: BC Managed Care – PPO | Admitting: *Deleted

## 2022-02-08 VITALS — Ht 67.0 in | Wt 152.0 lb

## 2022-02-08 DIAGNOSIS — Z86010 Personal history of colonic polyps: Secondary | ICD-10-CM

## 2022-02-08 DIAGNOSIS — R195 Other fecal abnormalities: Secondary | ICD-10-CM

## 2022-02-08 MED ORDER — NA SULFATE-K SULFATE-MG SULF 17.5-3.13-1.6 GM/177ML PO SOLN: 2.0000 | Freq: Once | ORAL | 0 refills | Status: AC

## 2022-02-08 NOTE — Progress Notes (Signed)

## 2022-02-16 ENCOUNTER — Encounter: Payer: Self-pay | Admitting: Gastroenterology

## 2022-02-22 ENCOUNTER — Ambulatory Visit (AMBULATORY_SURGERY_CENTER): Payer: Medicare Other | Admitting: Gastroenterology

## 2022-02-22 ENCOUNTER — Encounter: Payer: Self-pay | Admitting: Gastroenterology

## 2022-02-22 ENCOUNTER — Other Ambulatory Visit: Payer: Self-pay

## 2022-02-22 VITALS — BP 111/67 | HR 60 | Temp 98.9°F | Resp 12 | Ht 67.0 in | Wt 152.0 lb

## 2022-02-22 DIAGNOSIS — D125 Benign neoplasm of sigmoid colon: Secondary | ICD-10-CM

## 2022-02-22 DIAGNOSIS — K635 Polyp of colon: Secondary | ICD-10-CM

## 2022-02-22 DIAGNOSIS — Z86010 Personal history of colonic polyps: Secondary | ICD-10-CM | POA: Diagnosis not present

## 2022-02-22 DIAGNOSIS — R195 Other fecal abnormalities: Secondary | ICD-10-CM | POA: Diagnosis not present

## 2022-02-22 MED ORDER — SODIUM CHLORIDE 0.9 % IV SOLN: 500.0000 mL | Freq: Once | INTRAVENOUS | Status: DC

## 2022-02-22 NOTE — Patient Instructions (Signed)
Resume previous diet and current medications.  Await pathology results.   ? ?Repeat colonoscopy at the next available appointment at hospital endoscopy unit,  will discuss with Dr. Rush Landmark.   ? ?YOU HAD AN ENDOSCOPIC PROCEDURE TODAY AT [facility]:   Refer to the procedure report that was given to you for any specific questions about what was found during the examination.  If the procedure report does not answer your questions, please call your gastroenterologist to clarify.  If you requested that your care partner not be given the details of your procedure findings, then the procedure report has been included in a sealed envelope for you to review at your convenience later. ? ?YOU SHOULD EXPECT: Some feelings of bloating in the abdomen. Passage of more gas than usual.  Walking can help get rid of the air that was put into your GI tract during the procedure and reduce the bloating. If you had a lower endoscopy (such as a colonoscopy or flexible sigmoidoscopy) you may notice spotting of blood in your stool or on the toilet paper. If you underwent a bowel prep for your procedure, you may not have a normal bowel movement for a few days. ? ?Please Note:  You might notice some irritation and congestion in your nose or some drainage.  This is from the oxygen used during your procedure.  There is no need for concern and it should clear up in a day or so. ? ?SYMPTOMS TO REPORT IMMEDIATELY: ? ?Following lower endoscopy (colonoscopy or flexible sigmoidoscopy): ? Excessive amounts of blood in the stool ? Significant tenderness or worsening of abdominal pains ? Swelling of the abdomen that is new, acute ? Fever of 100?F or higher ?For urgent or emergent issues, a gastroenterologist can be reached at any hour by calling [phone]. ?Do not use MyChart messaging for urgent concerns.  ? ? ?DIET:  We do recommend a small meal at first, but then you may proceed to your regular diet.  Drink plenty of fluids  but you should avoid alcoholic beverages for 24 hours. ? ?ACTIVITY:  You should plan to take it easy for the rest of today and you should NOT DRIVE or use heavy machinery until tomorrow (because of the sedation medicines used during the test).   ? ?FOLLOW UP: ?Our staff will call the number listed on your records 48-72 hours following your procedure to check on you and address any questions or concerns that you may have regarding the information given to you following your procedure. If we do not reach you, we will leave a message.  We will attempt to reach you two times.  During this call, we will ask if you have developed any symptoms of COVID 19. If you develop any symptoms (ie: fever, flu-like symptoms, shortness of breath, cough etc.) before then, please call [phone].  If you test positive for Covid 19 in the 2 weeks post procedure, please call and report this information to [place].   ? ?If any biopsies were taken you will be contacted by phone or by letter within the next 1-3 weeks.  Please call us at [phone] if you have not heard about the biopsies in 3 weeks.  ? ? ?SIGNATURES/CONFIDENTIALITY: ?You and/or your care partner have signed paperwork which will be entered into your electronic medical record.  These signatures attest to the fact that that the information above on your After Visit Summary has been reviewed and is understood.  Full responsibility of  the confidentiality of this discharge information lies with you and/or your care-partner.  ?

## 2022-02-22 NOTE — Progress Notes (Signed)
Pt's states no medical or surgical changes since previsit or office visit. 

## 2022-02-22 NOTE — Progress Notes (Signed)
To pacu, VSS. Report to [email or web address] 

## 2022-02-22 NOTE — Progress Notes (Signed)
[facility] History and Physical ? ? ?Primary Care Physician:  Hayden Rasmussen, MD ? ? ?Reason for Procedure:  Positive Cologuard ? ?Plan:    Colonoscopy with possible interventions as needed ? ? ? ? ?HPI: Sonia Adams is a very pleasant 66 y.o. female here for colonoscopy for positive Cologuard. ?Denies any nausea, vomiting, abdominal pain, melena or bright red blood per rectum ? ?The risks and benefits as well as alternatives of endoscopic procedure(s) have been discussed and reviewed. All questions answered. The patient agrees to proceed. ? ? ? ?Past Medical History:  ?Diagnosis Date  ? Abnormal pap   ? HSV-2 infection   ? Menopause   ? ? ?Past Surgical History:  ?Procedure Laterality Date  ? BASAL CELL CARCINOMA EXCISION    ? upper lip  ? LUMBAR FUSION  12/18/2004  ? TONSILLECTOMY  12/18/1960  ? TUBAL LIGATION  12/19/1987  ? ? ?Prior to Admission medications   ?Medication Sig Start Date End Date Taking? Authorizing Provider  ?Calcium Citrate-Vitamin D (CALCIUM CITRATE+D3 PETITES) 200-6.25 MG-MCG TABS  01/06/22  Yes [provider]  ?Vitamin D, Ergocalciferol, (DRISDOL) 1.25 MG (50000 UNIT) CAPS capsule Take 50,000 Units by mouth once a week. 11/30/21  Yes [provider]  ? ? ?Current Outpatient Medications  ?Medication Sig Dispense Refill  ? Calcium Citrate-Vitamin D (CALCIUM CITRATE+D3 PETITES) 200-6.25 MG-MCG TABS     ? Vitamin D, Ergocalciferol, (DRISDOL) 1.25 MG (50000 UNIT) CAPS capsule Take 50,000 Units by mouth once a week.    ? ?Current Facility-Administered Medications  ?Medication Dose Route Frequency Provider Last Rate Last Admin  ? 0.9 %  sodium chloride infusion  500 mL Intravenous Once Nasrin Lanzo, Venia Minks, MD      ? ? ?Allergies as of 02/22/2022 - Review Complete 02/22/2022  ?Allergen Reaction Noted  ? Levaquin [levofloxacin] Hives 04/12/2021  ? ? ?Family History  ?Problem Relation Age of Onset  ? Diabetes Mother   ? Alzheimer's disease Mother   ? Stroke Father    ? Heart disease Father   ? Colon cancer Neg Hx   ? Colon polyps Neg Hx   ? Esophageal cancer Neg Hx   ? Stomach cancer Neg Hx   ? Rectal cancer Neg Hx   ? ? ?Social History  ? ?Socioeconomic History  ? Marital status: Married  ?  Spouse name: Not on file  ? Number of children: Not on file  ? Years of education: Not on file  ? Highest education level: Not on file  ?Occupational History  ? Not on file  ?Tobacco Use  ? Smoking status: Former  ?  Packs/day: 1.00  ?  Years: 20.00  ?  Pack years: 20.00  ?  Types: Cigarettes  ?  Quit date: 06/01/2007  ?  Years since quitting: 14.7  ? Smokeless tobacco: Never  ?Substance and Sexual Activity  ? Alcohol use: Yes  ?  Comment: once a month  ? Drug use: No  ? Sexual activity: Yes  ?  Partners: Male  ?  Birth control/protection: Post-menopausal  ?Other Topics Concern  ? Not on file  ?Social History Narrative  ? Not on file  ? ?Social Determinants of Health  ? ?Financial Resource Strain: Not on file  ?Food Insecurity: Not on file  ?Transportation Needs: Not on file  ?Physical Activity: Not on file  ?Stress: Not on file  ?Social Connections: Not on file  ?Intimate Partner Violence: Not on file  ? ? ?Review of  Systems: ? ?All other review of systems negative except as mentioned in the HPI. ? ?Physical Exam: ?Vital signs in last 24 hours: ?BP 121/72   Pulse 66   Temp 98.9 ?F (37.2 ?C)   Ht '5\' 7"'$  (1.702 m)   Wt 152 lb (68.9 kg)   LMP 01/01/2013   SpO2 99%   BMI 23.81 kg/m?  ?General:   Alert, NAD ?Lungs:  Clear .   ?Heart:  Regular rate and rhythm ?Abdomen:  Soft, nontender and nondistended. ?Neuro/Psych:  Alert and cooperative. Normal mood and affect. A and O x 3 ? ?Reviewed labs, radiology imaging, old records and pertinent past GI work up ? ?Patient is appropriate for planned procedure(s) and anesthesia in an ambulatory setting ? ? ?K. Denzil Magnuson , MD ?[phone]  ? ? ?  ?

## 2022-02-22 NOTE — Progress Notes (Signed)
Called to room to assist during endoscopic procedure.  Patient ID and intended procedure confirmed with present staff. Received instructions for my participation in the procedure from the performing physician.  

## 2022-02-22 NOTE — Op Note (Signed)
[facility] ?Patient Name: Sonia Adams ?Procedure Date: 02/22/2022 9:29 AM ?MRN: [id number] ?Endoscopist: Mauri Pole , MD ?Age: 66 ?Referring MD:  ?Date of Birth: 12/24/1955 ?Gender: Female ?Account #: [id number] ?Procedure:                Colonoscopy ?Indications:              Positive Cologuard test ?Medicines:                Monitored Anesthesia Care ?Procedure:                Pre-Anesthesia Assessment: ?                          - Prior to the procedure, a History and Physical  ?                          was performed, and patient medications and  ?                          allergies were reviewed. The patient's tolerance of  ?                          previous anesthesia was also reviewed. The risks  ?                          and benefits of the procedure and the sedation  ?                          options and risks were discussed with the patient.  ?                          All questions were answered, and informed consent  ?                          was obtained. Prior Anticoagulants: The patient has  ?                          taken no previous anticoagulant or antiplatelet  ?                          agents. ASA Grade Assessment: II - A patient with  ?                          mild systemic disease. After reviewing the risks  ?                          and benefits, the patient was deemed in  ?                          satisfactory condition to undergo the procedure. ?                          After obtaining informed consent, the colonoscope  ?  was passed under direct vision. Throughout the  ?                          procedure, the patient's blood pressure, pulse, and  ?                          oxygen saturations were monitored continuously. The  ?                          Olympus PCF-[id number] ([id number]) Colonoscope was  ?                          introduced through the anus and advanced to the the  ?                          cecum, identified by  appendiceal orifice and  ?                          ileocecal valve. The colonoscopy was performed  ?                          without difficulty. The patient tolerated the  ?                          procedure well. The quality of the bowel  ?                          preparation was adequate. The ileocecal valve,  ?                          appendiceal orifice, and rectum were photographed. ?Scope In: 9:33:54 AM ?Scope Out: 9:58:54 AM ?Scope Withdrawal Time: 0 hours 14 minutes 56 seconds  ?Total Procedure Duration: 0 hours 25 minutes 0 seconds  ?Findings:                 The perianal and digital rectal examinations were  ?                          normal. ?                          A 25 mm polyp was found in the ascending colon. The  ?                          polyp was non-granular lateral spreading.  ?                          Polypectomy was not attempted due to polyp size  ?                          (too large to be excised). Area was tattooed with  ?                          an injection of 2 mL of Spot (carbon black). ?  Scattered small and large-mouthed diverticula were  ?                          found in the sigmoid colon. ?                          A 3 mm polyp was found in the sigmoid colon. The  ?                          polyp was sessile. The polyp was removed with a  ?                          cold snare. Resection and retrieval were complete. ?                          Non-bleeding external and internal hemorrhoids were  ?                          found during retroflexion. The hemorrhoids were  ?                          medium-sized. ?Complications:            No immediate complications. ?Estimated Blood Loss:     Estimated blood loss was minimal. ?Impression:               - One 25 mm polyp in the ascending colon. Resection  ?                          not attempted. Tattooed. ?                          - Diverticulosis in the sigmoid colon. ?                          -  One 3 mm polyp in the sigmoid colon, removed with  ?                          a cold snare. Resected and retrieved. ?                          - Non-bleeding external and internal hemorrhoids. ?Recommendation:           - Patient has a contact number available for  ?                          emergencies. The signs and symptoms of potential  ?                          delayed complications were discussed with the  ?                          patient. Return to normal activities tomorrow.  ?  Written discharge instructions were provided to the  ?                          patient. ?                          - Resume previous diet. ?                          - Continue present medications. ?                          - Await pathology results. ?                          - Repeat colonoscopy at the next available  ?                          appointment at hospital endoscopy unit, will  ?                          discuss with Dr Rush Landmark. ?Mauri Pole, MD ?02/22/2022 10:08:15 AM ?This report has been signed electronically. ?

## 2022-02-22 NOTE — Progress Notes (Signed)
C.W. vital signs. 

## 2022-02-24 ENCOUNTER — Telehealth: Payer: Self-pay

## 2022-02-24 NOTE — Telephone Encounter (Signed)
?  Follow up Call- ? ?Call back number 02/22/2022  ?Post procedure Call Back phone  # [phone]  ?Permission to leave phone message Yes  ?Some recent data might be hidden  ?  ? ?Patient questions: ? ?Do you have a fever, pain , or abdominal swelling? No. ?Pain Score  0 * ? ?Have you tolerated food without any problems? Yes.   ? ?Have you been able to return to your normal activities? Yes.   ? ?Do you have any questions about your discharge instructions: ?Diet   No. ?Medications  No. ?Follow up visit  No. ? ?Do you have questions or concerns about your Care? No. ? ?Actions: ?* If pain score is 4 or above: ?No action needed, pain <4. ? ? ?

## 2022-03-02 ENCOUNTER — Other Ambulatory Visit: Payer: Self-pay

## 2022-03-02 MED ORDER — PEG 3350-KCL-NA BICARB-NACL 420 G PO SOLR: 4000.0000 mL | Freq: Once | ORAL | 0 refills | Status: AC

## 2022-04-20 ENCOUNTER — Encounter (HOSPITAL_COMMUNITY): Payer: Self-pay | Admitting: Gastroenterology

## 2022-04-20 NOTE — Progress Notes (Signed)
Attempted to obtain medical history via telephone, unable to reach at this time. I left a voicemail to return pre surgical testing department's phone call.  

## 2022-04-27 ENCOUNTER — Ambulatory Visit (HOSPITAL_COMMUNITY)
Admission: RE | Admit: 2022-04-27 | Discharge: 2022-04-27 | Disposition: A | Discharge status: Home / Self Care | Payer: Medicare Other | Attending: Gastroenterology | Admitting: Gastroenterology

## 2022-04-27 ENCOUNTER — Other Ambulatory Visit: Payer: Self-pay

## 2022-04-27 ENCOUNTER — Encounter (HOSPITAL_COMMUNITY)
Admission: RE | Disposition: A | Discharge status: Home / Self Care | Payer: Self-pay | Source: Home / Self Care | Attending: Gastroenterology

## 2022-04-27 ENCOUNTER — Ambulatory Visit (HOSPITAL_BASED_OUTPATIENT_CLINIC_OR_DEPARTMENT_OTHER): Payer: Medicare Other | Admitting: Certified Registered Nurse Anesthetist

## 2022-04-27 ENCOUNTER — Encounter (HOSPITAL_COMMUNITY): Payer: Self-pay | Admitting: Gastroenterology

## 2022-04-27 ENCOUNTER — Ambulatory Visit (HOSPITAL_COMMUNITY): Payer: Medicare Other | Admitting: Certified Registered Nurse Anesthetist

## 2022-04-27 DIAGNOSIS — J45909 Unspecified asthma, uncomplicated: Secondary | ICD-10-CM | POA: Insufficient documentation

## 2022-04-27 DIAGNOSIS — Q438 Other specified congenital malformations of intestine: Secondary | ICD-10-CM | POA: Insufficient documentation

## 2022-04-27 DIAGNOSIS — K641 Second degree hemorrhoids: Secondary | ICD-10-CM | POA: Insufficient documentation

## 2022-04-27 DIAGNOSIS — D122 Benign neoplasm of ascending colon: Secondary | ICD-10-CM | POA: Insufficient documentation

## 2022-04-27 DIAGNOSIS — K621 Rectal polyp: Secondary | ICD-10-CM

## 2022-04-27 DIAGNOSIS — K5730 Diverticulosis of large intestine without perforation or abscess without bleeding: Secondary | ICD-10-CM | POA: Insufficient documentation

## 2022-04-27 DIAGNOSIS — D128 Benign neoplasm of rectum: Secondary | ICD-10-CM | POA: Diagnosis not present

## 2022-04-27 DIAGNOSIS — Z87891 Personal history of nicotine dependence: Secondary | ICD-10-CM | POA: Insufficient documentation

## 2022-04-27 DIAGNOSIS — K635 Polyp of colon: Secondary | ICD-10-CM | POA: Diagnosis not present

## 2022-04-27 HISTORY — PX: COLONOSCOPY WITH PROPOFOL: SHX5780

## 2022-04-27 HISTORY — PX: ENDOSCOPIC MUCOSAL RESECTION: SHX6839

## 2022-04-27 HISTORY — PX: SUBMUCOSAL LIFTING INJECTION: SHX6855

## 2022-04-27 HISTORY — PX: POLYPECTOMY: SHX5525

## 2022-04-27 HISTORY — PX: HEMOSTASIS CLIP PLACEMENT: SHX6857

## 2022-04-27 SURGERY — COLONOSCOPY WITH PROPOFOL
Anesthesia: Monitor Anesthesia Care

## 2022-04-27 MED ORDER — PROPOFOL 500 MG/50ML IV EMUL
INTRAVENOUS | Status: DC | PRN
  Administered 2022-04-27: 150 ug/kg/min via INTRAVENOUS

## 2022-04-27 MED ORDER — PROPOFOL 10 MG/ML IV BOLUS
INTRAVENOUS | Status: DC | PRN
  Administered 2022-04-27: 40 mg via INTRAVENOUS
  Administered 2022-04-27: 20 mg via INTRAVENOUS

## 2022-04-27 MED ORDER — LACTATED RINGERS IV SOLN: INTRAVENOUS | Status: DC | PRN

## 2022-04-27 MED ORDER — LIDOCAINE 2% (20 MG/ML) 5 ML SYRINGE
INTRAMUSCULAR | Status: DC | PRN
  Administered 2022-04-27: 50 mg via INTRAVENOUS

## 2022-04-27 SURGICAL SUPPLY — 22 items

## 2022-04-27 NOTE — Anesthesia Preprocedure Evaluation (Addendum)
Anesthesia Evaluation  ?Patient identified by MRN, date of birth, ID band ?Patient awake ? ? ? ?Reviewed: ?Allergy & Precautions, NPO status , Patient's Chart, lab work & pertinent test results ? ?Airway ?Mallampati: I ? ?TM Distance: >3 FB ?Neck ROM: Full ? ? ? Dental ? ?(+) Teeth Intact, Dental Advisory Given ?  ?Pulmonary ?asthma , former smoker,  ?  ?breath sounds clear to auscultation ? ? ? ? ? ? Cardiovascular ?negative cardio ROS ? ? ?Rhythm:Regular Rate:Normal ? ? ?  ?Neuro/Psych ?negative neurological ROS ? negative psych ROS  ? GI/Hepatic ?negative GI ROS, Neg liver ROS,   ?Endo/Other  ?negative endocrine ROS ? Renal/GU ?negative Renal ROS  ? ?  ?Musculoskeletal ?negative musculoskeletal ROS ?(+)  ? Abdominal ?Normal abdominal exam  (+)   ?Peds ? Hematology ?negative hematology ROS ?(+)   ?Anesthesia Other Findings ? ? Reproductive/Obstetrics ? ?  ? ? ? ? ? ? ? ? ? ? ? ? ? ?  ?  ? ? ? ? ? ? ? ?Anesthesia Physical ?Anesthesia Plan ? ?ASA: 2 ? ?Anesthesia Plan: MAC  ? ?Post-op Pain Management:   ? ?Induction:  ? ?PONV Risk Score and Plan: 0 and Propofol infusion ? ?Airway Management Planned: Natural Airway and Simple Face Mask ? ?Additional Equipment: None ? ?Intra-op Plan:  ? ?Post-operative Plan:  ? ?Informed Consent: I have reviewed the patients History and Physical, chart, labs and discussed the procedure including the risks, benefits and alternatives for the proposed anesthesia with the patient or authorized representative who has indicated his/her understanding and acceptance.  ? ? ? ? ? ?Plan Discussed with: CRNA ? ?Anesthesia Plan Comments:   ? ? ? ? ? ?Anesthesia Quick Evaluation ? ?

## 2022-04-27 NOTE — Transfer of Care (Signed)
Immediate Anesthesia Transfer of Care Note ? ?Patient: CAMESHIA CRESSMAN ? ?Procedure(s) Performed: COLONOSCOPY WITH PROPOFOL ?ENDOSCOPIC MUCOSAL RESECTION ?SUBMUCOSAL LIFTING INJECTION ?HEMOSTASIS CLIP PLACEMENT ?POLYPECTOMY ? ?Patient Location: PACU ? ?Anesthesia Type:MAC ? ?Level of Consciousness: awake and alert  ? ?Airway & Oxygen Therapy: Patient Spontanous Breathing and Patient connected to nasal cannula oxygen ? ?Post-op Assessment: Report given to RN and Post -op Vital signs reviewed and stable ? ?Post vital signs: Reviewed and stable ? ?Last Vitals:  ?Vitals Value Taken Time  ?BP    ?Temp    ?Pulse 64 04/27/22 1005  ?Resp 15 04/27/22 1005  ?SpO2 100 % 04/27/22 1005  ?Vitals shown include unvalidated device data. ? ?Last Pain:  ?Vitals:  ? 04/27/22 0830  ?TempSrc: Temporal  ?PainSc: 0-No pain  ?   ? ?  ? ?Complications: No notable events documented. ?

## 2022-04-27 NOTE — H&P (Signed)
? ?GASTROENTEROLOGY PROCEDURE H&P NOTE  ? ?Primary Care Physician: ?Hayden Rasmussen, MD ? ?HPI: ?Sonia Adams is a 66 y.o. female who presents for Colonoscopy for attempt at EMR of large AC polyp. ? ?Past Medical History:  ?Diagnosis Date  ? Abnormal pap   ? HSV-2 infection   ? Menopause   ? ?Past Surgical History:  ?Procedure Laterality Date  ? BASAL CELL CARCINOMA EXCISION    ? upper lip  ? LUMBAR FUSION  12/18/2004  ? TONSILLECTOMY  12/18/1960  ? TUBAL LIGATION  12/19/1987  ? ?No current facility-administered medications for this encounter.  ? ?No current facility-administered medications for this encounter. ?Allergies  ?Allergen Reactions  ? Levaquin [Levofloxacin] Hives  ? ?Family History  ?Problem Relation Age of Onset  ? Diabetes Mother   ? Alzheimer's disease Mother   ? Stroke Father   ? Heart disease Father   ? Colon cancer Neg Hx   ? Colon polyps Neg Hx   ? Esophageal cancer Neg Hx   ? Stomach cancer Neg Hx   ? Rectal cancer Neg Hx   ? ?Social History  ? ?Socioeconomic History  ? Marital status: Married  ?  Spouse name: Not on file  ? Number of children: Not on file  ? Years of education: Not on file  ? Highest education level: Not on file  ?Occupational History  ? Not on file  ?Tobacco Use  ? Smoking status: Former  ?  Packs/day: 1.00  ?  Years: 20.00  ?  Pack years: 20.00  ?  Types: Cigarettes  ?  Quit date: 06/01/2007  ?  Years since quitting: 14.9  ? Smokeless tobacco: Never  ?Substance and Sexual Activity  ? Alcohol use: Yes  ?  Comment: once a month  ? Drug use: No  ? Sexual activity: Yes  ?  Partners: Male  ?  Birth control/protection: Post-menopausal  ?Other Topics Concern  ? Not on file  ?Social History Narrative  ? Not on file  ? ?Social Determinants of Health  ? ?Financial Resource Strain: Not on file  ?Food Insecurity: Not on file  ?Transportation Needs: Not on file  ?Physical Activity: Not on file  ?Stress: Not on file  ?Social Connections: Not on file  ?Intimate Partner Violence: Not on  file  ? ? ?Physical Exam: ?Today's Vitals  ? 04/27/22 0830  ?BP: (!) 106/42  ?Pulse: (!) 58  ?Resp: 14  ?Temp: 98 ?F (36.7 ?C)  ?TempSrc: Temporal  ?SpO2: 94%  ?Weight: 68.9 kg  ?Height: '5\' 7"'$  (1.702 m)  ?PainSc: 0-No pain  ? ?Body mass index is 23.81 kg/m?. ?GEN: NAD ?EYE: Sclerae anicteric ?ENT: MMM ?CV: Non-tachycardic ?GI: Soft, NT/ND ?NEURO:  Alert & Oriented x 3 ? ?Lab Results: ?No results for input(s): WBC, HGB, HCT, PLT in the last 72 hours. ?BMET ?No results for input(s): NA, K, CL, CO2, GLUCOSE, BUN, CREATININE, CALCIUM in the last 72 hours. ?LFT ?No results for input(s): PROT, ALBUMIN, AST, ALT, ALKPHOS, BILITOT, BILIDIR, IBILI in the last 72 hours. ?PT/INR ?No results for input(s): LABPROT, INR in the last 72 hours. ? ? ?Impression / Plan: ?This is a 66 y.o.female who presents for Colonoscopy for attempt at EMR of large AC polyp. ? ?The risks and benefits of endoscopic evaluation/treatment were discussed with the patient and/or family; these include but are not limited to the risk of perforation, infection, bleeding, missed lesions, lack of diagnosis, severe illness requiring hospitalization, as well as anesthesia and sedation  related illnesses.  The patient's history has been reviewed, patient examined, no change in status, and deemed stable for procedure.  The patient and/or family is agreeable to proceed.  ? ? ?Justice Britain, MD ?[facility] ?Advanced Endoscopy ?Office # [phone] ? ?

## 2022-04-27 NOTE — Anesthesia Postprocedure Evaluation (Signed)
Anesthesia Post Note ? ?Patient: Sonia Adams ? ?Procedure(s) Performed: COLONOSCOPY WITH PROPOFOL ?ENDOSCOPIC MUCOSAL RESECTION ?SUBMUCOSAL LIFTING INJECTION ?HEMOSTASIS CLIP PLACEMENT ?POLYPECTOMY ? ?  ? ?Patient location during evaluation: PACU ?Anesthesia Type: MAC ?Level of consciousness: awake and alert ?Pain management: pain level controlled ?Vital Signs Assessment: post-procedure vital signs reviewed and stable ?Respiratory status: spontaneous breathing, nonlabored ventilation, respiratory function stable and patient connected to nasal cannula oxygen ?Cardiovascular status: stable and blood pressure returned to baseline ?Postop Assessment: no apparent nausea or vomiting ?Anesthetic complications: no ? ? ?No notable events documented. ? ?Last Vitals:  ?Vitals:  ? 04/27/22 1006 04/27/22 1020  ?BP: 103/61 110/69  ?Pulse: 64 60  ?Resp: 15 14  ?Temp: (!) 36.4 ?C 36.6 ?C  ?SpO2: 100% 100%  ?  ?Last Pain:  ?Vitals:  ? 04/27/22 1020  ?TempSrc:   ?PainSc: 0-No pain  ? ? ?  ?  ?  ?  ?  ?  ? ?Effie Berkshire ? ? ? ? ?

## 2022-04-27 NOTE — Op Note (Signed)
[facility] ?Patient Name: Sonia Adams ?Procedure Date : 04/27/2022 ?MRN: [id number] ?Attending MD: Justice Britain , MD ?Date of Birth: 05-22-1956 ?CSN: [id number] ?Age: 66 ?Admit Type: Inpatient ?Procedure:                Colonoscopy ?Indications:              Excision of colonic polyp ?Providers:                Justice Britain, MD, Jeanella Cara, RN,  ?                          Tyna Jaksch Technician ?Referring MD:             Mauri Pole, MD, Maebelle Munroe Darron Doom, MD ?Medicines:                Monitored Anesthesia Care ?Complications:            No immediate complications. ?Estimated Blood Loss:     Estimated blood loss was minimal. ?Procedure:                Pre-Anesthesia Assessment: ?                          - Prior to the procedure, a History and Physical  ?                          was performed, and patient medications and  ?                          allergies were reviewed. The patient's tolerance of  ?                          previous anesthesia was also reviewed. The risks  ?                          and benefits of the procedure and the sedation  ?                          options and risks were discussed with the patient.  ?                          All questions were answered, and informed consent  ?                          was obtained. Prior Anticoagulants: The patient has  ?                          taken no previous anticoagulant or antiplatelet  ?                          agents. ASA Grade Assessment: II - A patient with  ?                          mild systemic disease. After reviewing the risks  ?  and benefits, the patient was deemed in  ?                          satisfactory condition to undergo the procedure. ?                          After obtaining informed consent, the colonoscope  ?                          was passed under direct vision. Throughout the  ?                          procedure, the patient's blood pressure,  pulse, and  ?                          oxygen saturations were monitored continuously. The  ?                          [id number]-HQ190TL ([id number]) Olympus peds colonoscope was  ?                          introduced through the anus and advanced to the the  ?                          cecum, identified by appendiceal orifice and  ?                          ileocecal valve. The colonoscopy was somewhat  ?                          difficult due to a redundant colon and significant  ?                          looping. Successful completion of the procedure was  ?                          aided by changing the patient's position, using  ?                          manual pressure, straightening and shortening the  ?                          scope to obtain bowel loop reduction and using  ?                          scope torsion. The patient tolerated the procedure.  ?                          The quality of the bowel preparation was adequate.  ?                          The terminal ileum, ileocecal valve, appendiceal  ?  orifice, and rectum were photographed. ?Scope In: 9:13:17 AM ?Scope Out: 9:58:58 AM ?Scope Withdrawal Time: 0 hours 34 minutes 59 seconds  ?Total Procedure Duration: 0 hours 45 minutes 41 seconds  ?Findings: ?     The digital rectal exam findings include hemorrhoids. Pertinent  ?     negatives include no palpable rectal lesions. ?     The colon (entire examined portion) revealed grossly excessive looping. ?     The terminal ileum and ileocecal valve appeared normal. ?     A 35 mm polyp was found in the proximal ascending colon. The polyp was  ?     sessile. Previously placed tattoo had found its way underneath the  ?     lesion unfortunately. Preparations were made for attempt at mucosal  ?     resection. NBI imaging and White-light endoscopy was done to demarcate  ?     the borders of the lesion. Everlift was injected to raise the lesion (10  ?     cc). Piecemeal mucosal resection using  a snare was performed. Resection  ?     and retrieval were complete. To prevent bleeding after mucosal  ?     resection, four hemostatic clips were successfully placed (MR  ?     conditional). There was no bleeding at the end of the procedure. ?     A few small-mouthed diverticula were found in the ascending colon. ?     Two sessile polyps were found in the rectum. The polyps were 2 to 3 mm  ?     in size. These polyps were removed with a cold snare. Resection and  ?     retrieval were complete. ?     Normal mucosa was found in the entire colon otherwise. ?     Non-bleeding non-thrombosed external and internal hemorrhoids were found  ?     during retroflexion, during perianal exam and during digital exam. The  ?     hemorrhoids were Grade II (internal hemorrhoids that prolapse but reduce  ?     spontaneously). ?Impression:               - Hemorrhoids found on digital rectal exam. ?                          - There was significant looping and redundancy of  ?                          the colon. ?                          - The examined portion of the ileum was normal. ?                          - One 35 mm polyp in the proximal ascending colon  ?                          found with tattoo underneath, removed with  ?                          piecemeal mucosal resection. Resected and  ?  retrieved. Clips (MR conditional) were placed. ?                          - Diverticulosis in the ascending colon. ?                          - Two 2 to 3 mm polyps in the rectum, removed with  ?                          a cold snare. Resected and retrieved. ?                          - Normal mucosa in the entire examined colon  ?                          otherwise. ?                          - Non-bleeding non-thrombosed external and internal  ?                          hemorrhoids. ?Recommendation:           - The patient will be observed post-procedure,  ?                          until all discharge  criteria are met. ?                          - Discharge patient to home. ?                          - Patient has a contact number available for  ?                          emergencies. The signs and symptoms of potential  ?                          delayed complications were discussed with the  ?                          patient. Return to normal activities tomorrow.  ?                          Written discharge instructions were provided to the  ?                          patient. ?                          - High fiber diet. ?                          - Use FiberCon 1-2 tablets PO daily. ?                          -  No aspirin, ibuprofen, naproxen, or other  ?                          non-steroidal anti-inflammatory drugs for 2 weeks  ?                          after polyp removal. ?                          - Continue present medications. ?                          - Await pathology results. ?                          - Repeat colonoscopy with 9-12 months for  ?                          surveillance. ?                          - The findings and recommendations were discussed  ?                          with the patient. ?                          - The findings and recommendations were discussed  ?                          with the patient's family. ?Procedure Code(s):        --- Professional --- ?                          [phone], Colonoscopy, flexible; with endoscopic  ?                          mucosal resection ?                          73710, 59, Colonoscopy, flexible; with removal of  ?                          tumor(s), polyp(s), or other lesion(s) by snare  ?                          technique ?Diagnosis Code(s):        --- Professional --- ?                          K64.1, Second degree hemorrhoids ?                          K63.5, Polyp of colon ?                          K62.1, Rectal polyp ?  K57.30, Diverticulosis of large intestine without  ?                           perforation or abscess without bleeding ?CPT copyright 2019 [employer]. All rights reserved. ?The codes documented in this report are preliminary and upon coder review may  ?be revised to meet cu

## 2022-05-05 LAB — SURGICAL PATHOLOGY

## 2022-05-10 ENCOUNTER — Encounter: Payer: Self-pay | Admitting: Gastroenterology

## 2022-06-27 ENCOUNTER — Other Ambulatory Visit: Payer: Self-pay | Admitting: Family Medicine

## 2022-06-27 DIAGNOSIS — J3489 Other specified disorders of nose and nasal sinuses: Secondary | ICD-10-CM

## 2022-07-25 ENCOUNTER — Ambulatory Visit
Admission: RE | Admit: 2022-07-25 | Discharge: 2022-07-25 | Disposition: A | Discharge status: Home / Self Care | Payer: Medicare Other | Source: Ambulatory Visit | Attending: Family Medicine | Admitting: Family Medicine

## 2022-07-25 DIAGNOSIS — J3489 Other specified disorders of nose and nasal sinuses: Secondary | ICD-10-CM

## 2023-05-29 ENCOUNTER — Encounter: Payer: Self-pay | Admitting: Sports Medicine

## 2023-05-29 ENCOUNTER — Other Ambulatory Visit (INDEPENDENT_AMBULATORY_CARE_PROVIDER_SITE_OTHER): Payer: Medicare Other

## 2023-05-29 ENCOUNTER — Ambulatory Visit: Payer: Medicare Other | Admitting: Sports Medicine

## 2023-05-29 DIAGNOSIS — M11261 Other chondrocalcinosis, right knee: Secondary | ICD-10-CM | POA: Diagnosis not present

## 2023-05-29 DIAGNOSIS — M25561 Pain in right knee: Secondary | ICD-10-CM

## 2023-05-29 DIAGNOSIS — M25461 Effusion, right knee: Secondary | ICD-10-CM | POA: Diagnosis not present

## 2023-05-29 NOTE — Progress Notes (Signed)
Sonia Adams - 67 y.o. female MRN [id number]  Date of birth: 05-18-1956  Office Visit Note: Visit Date: 05/29/2023 PCP: Dois Davenport, MD Referred by: Dois Davenport, MD  Subjective: Chief Complaint  Patient presents with   Right Knee - Pain   HPI: Sonia Adams is a pleasant 67 y.o. female who presents today for right knee pain with injury about 6 weeks ago.  About 6 weeks ago she was bowling with coworkers and bowls, the right knee suddenly gave out on her.  She did not stumble or fall.  She did have some pain but was able to continue bowling.  She thought the pain would go away but still continues.  Her pain is worse on the anterior medial aspect of the knee.  Does feel like she has some swelling in the knee as well.  Pain is not all the time but is certainly worse with going down steps.  She does have some crepitus of the knees chronically but denies any locking, clicking or catching about the knee.  No previous injury to the knee.  She is taking occasional Tylenol for pain control.  Pertinent ROS were reviewed with the patient and found to be negative unless otherwise specified above in HPI.   Assessment & Plan: Visit Diagnoses:  1. Acute pain of right knee   2. Chondrocalcinosis of right knee   3. Effusion, right knee    Plan: Discussed with Neta Mends possible etiology of her left knee pain/injury.  She did have the knee gives out on her about 6 weeks ago when bowling.  She does have a small to moderate effusion of the knee.  X-rays are reassuring against fracture and no significant arthritic change.  She does not have classic meniscal symptoms but did discuss it could possibly be meniscal pathology, however she does have chondrocalcinosis of the knee and this fall could have flared this up causing her effusion.  Discussed all treatment options such as oral medication, aspiration and injection therapy, formalized physical therapy.  She would like to hold off on aspiration and  injection at this time which is reasonable.  We will start her on Aleve to be taken twice daily with food for the next 2 weeks scheduled then as needed, she can ice the knee as well for any swelling.  Will get her started in formalized physical therapy.  I would like to see her back in 1 month after starting physical therapy to reevaluate.  Follow-up: Return in about 5 weeks (around 07/03/2023) for f/u 1 month after starting PT for right knee pain/swelling.   Meds & Orders: No orders of the defined types were placed in this encounter.   Orders Placed This Encounter  Procedures   XR Knee Complete 4 Views Right   [place] Extrem Low Right Ltd     Procedures: No procedures performed      Clinical History: No specialty comments available.  She reports that she quit smoking about 16 years ago. Her smoking use included cigarettes. She has a 20.00 pack-year smoking history. She has never used smokeless tobacco. No results for input(s): "HGBA1C", "LABURIC" in the last 8760 hours.  Objective:   Vital Signs: LMP 01/01/2013   Physical Exam  Gen: Well-appearing, in no acute distress; non-toxic CV: Well-perfused. Warm.  Resp: Breathing unlabored on room air; no wheezing. Psych: Fluid speech in conversation; appropriate affect; normal thought process Neuro: Sensation intact throughout. No gross coordination deficits.   Ortho Exam -  Right knee: There is a small to moderate effusion noted of the knee joint.  Positive TTP over the medial joint line and to a lesser degree the lateral joint line.  Range of motion preserved from 0-130 degrees.  There is no varus or valgus instability.  Negative Lachman, negative anterior/posterior drawer.  There is some pain with McMurray's testing only upon palpation of the lateral joint line but no appreciable clicking.  5/5 in all directions with flexion and extension.  Imaging: XR Knee Complete 4 Views Right  Result Date: 05/29/2023 4 views of the right knee including  AP standing, Rosenberg, lateral and sunrise views were ordered and reviewed by myself.  X-rays demonstrate moderate patellofemoral arthralgia, there is relatively well-preserved tibiofemoral joint space without significant arthritic change.  There is chondrocalcinosis noted throughout the lateral greater than medial joint line.  No acute fracture noted.  [place] Extrem Low Right Ltd  Result Date: 05/29/2023 Limited musculoskeletal ultrasound of the right lower extremity, right knee was performed today.  Evaluation shows overlying quadricep tendon without pathology.  There is a small to moderate effusion in the suprapatellar pouch of the knee.  The medial joint line shows some calcification, likely chondrocalcinosis over the medial joint.  No cortical irregularity of the medial femoral condyle noted.  Patellar tendon seen intact with proper attachment and insertion on the inferior patella to the tibial tubercle without evidence of tearing. Impression: Small to moderate joint effusion with chondrocalcinosis noted within the joint   Past Medical/Family/Surgical/Social History: Medications & Allergies reviewed per EMR, new medications updated. Patient Active Problem List   Diagnosis Date Noted   Personal history of colonic polyps 07/08/2013   Nonspecific abnormal electrocardiogram (ECG) (EKG) 06/24/2013   Multinodular goiter 06/24/2013   Herniated cervical disc 06/24/2013   Former smoker  35 year  declines screening chest CT  cessation 2008 06/24/2013   Family history of breast cancer in first degree relative 06/24/2013   Abnormal EKG 06/24/2013   Asthmatic bronchitis 01/06/2013   Allergic rhinitis 08/01/2012   Dyspareunia 08/01/2012   Symptoms, such as flushing, sleeplessness, headache, lack of concentration, associated with the menopause 08/01/2012   Hyperlipidemia 08/01/2012   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/01/2012   Abnormal Pap smear of cervix 08/01/2012   Past Medical History:  Diagnosis Date    Abnormal pap    HSV-2 infection    Menopause    Family History  Problem Relation Age of Onset   Diabetes Mother    Alzheimer's disease Mother    Stroke Father    Heart disease Father    Colon cancer Neg Hx    Colon polyps Neg Hx    Esophageal cancer Neg Hx    Stomach cancer Neg Hx    Rectal cancer Neg Hx    Past Surgical History:  Procedure Laterality Date   BASAL CELL CARCINOMA EXCISION     upper lip   COLONOSCOPY WITH PROPOFOL N/A 04/27/2022   Procedure: COLONOSCOPY WITH PROPOFOL;  Surgeon: Lemar Lofty., MD;  Location: [facility] ENDOSCOPY;  Service: Gastroenterology;  Laterality: N/A;   ENDOSCOPIC MUCOSAL RESECTION N/A 04/27/2022   Procedure: ENDOSCOPIC MUCOSAL RESECTION;  Surgeon: Meridee Score Netty Starring., MD;  Location: [facility] ENDOSCOPY;  Service: Gastroenterology;  Laterality: N/A;   HEMOSTASIS CLIP PLACEMENT  04/27/2022   Procedure: HEMOSTASIS CLIP PLACEMENT;  Surgeon: Lemar Lofty., MD;  Location: [facility] ENDOSCOPY;  Service: Gastroenterology;;   LUMBAR FUSION  12/18/2004   POLYPECTOMY  04/27/2022   Procedure: POLYPECTOMY;  Surgeon: Corliss Parish  Montez Hageman., MD;  Location: [facility] ENDOSCOPY;  Service: Gastroenterology;;   SUBMUCOSAL LIFTING INJECTION  04/27/2022   Procedure: SUBMUCOSAL LIFTING INJECTION;  Surgeon: Lemar Lofty., MD;  Location: [facility] ENDOSCOPY;  Service: Gastroenterology;;   TONSILLECTOMY  12/18/1960   TUBAL LIGATION  12/19/1987   Social History   Occupational History   Not on file  Tobacco Use   Smoking status: Former    Packs/day: 1.00    Years: 20.00    Additional pack years: 0.00    Total pack years: 20.00    Types: Cigarettes    Quit date: 06/01/2007    Years since quitting: 16.0   Smokeless tobacco: Never  Substance and Sexual Activity   Alcohol use: Yes    Comment: once a month   Drug use: No   Sexual activity: Yes    Partners: Male    Birth control/protection: Post-menopausal

## 2023-05-29 NOTE — Progress Notes (Signed)
6 weeks of pain Bowling incident Right knee "gave out" She did not fall, but did stumble States it is very painful at times/ but achey other Can't tell if knee is swollen  She states that bending knee; especially when going down stairs or getting up from a "low" seated position is tough She takes occasional tylenol when it is severe pain Has tried a heating pad at night to relieve the pain

## 2023-06-06 ENCOUNTER — Other Ambulatory Visit: Payer: Self-pay | Admitting: Sports Medicine

## 2023-06-06 ENCOUNTER — Telehealth: Payer: Self-pay | Admitting: Sports Medicine

## 2023-06-06 DIAGNOSIS — M25561 Pain in right knee: Secondary | ICD-10-CM

## 2023-06-06 NOTE — Telephone Encounter (Signed)
Patient called. Says she was suppose to get a referral for PT. Her call back number is [phone]

## 2023-06-12 ENCOUNTER — Ambulatory Visit: Payer: Medicare Other | Admitting: Physical Therapy

## 2023-06-12 ENCOUNTER — Encounter: Payer: Self-pay | Admitting: Physical Therapy

## 2023-06-12 ENCOUNTER — Other Ambulatory Visit: Payer: Self-pay

## 2023-06-12 DIAGNOSIS — M6281 Muscle weakness (generalized): Secondary | ICD-10-CM | POA: Diagnosis not present

## 2023-06-12 DIAGNOSIS — M25561 Pain in right knee: Secondary | ICD-10-CM | POA: Diagnosis not present

## 2023-06-12 DIAGNOSIS — R600 Localized edema: Secondary | ICD-10-CM

## 2023-06-12 NOTE — Therapy (Signed)
OUTPATIENT PHYSICAL THERAPY LOWER EXTREMITY EVALUATION   Patient Name: Sonia Adams MRN: [id number] DOB:11/16/1956, 67 y.o., female Today's Date: 06/12/2023  END OF SESSION:  PT End of Session - 06/12/23 1644     Visit Number 1    Number of Visits 13    Date for PT Re-Evaluation 07/24/23    Authorization Type [employer] MCR    Authorization Time Period 06/12/23 to 07/24/23    Progress Note Due on Visit 10    PT Start Time 1602    PT Stop Time 1640    PT Time Calculation (min) 38 min    Activity Tolerance Patient tolerated treatment well    Behavior During Therapy [facility] for tasks assessed/performed             Past Medical History:  Diagnosis Date   Abnormal pap    HSV-2 infection    Menopause    Past Surgical History:  Procedure Laterality Date   BASAL CELL CARCINOMA EXCISION     upper lip   COLONOSCOPY WITH PROPOFOL N/A 04/27/2022   Procedure: COLONOSCOPY WITH PROPOFOL;  Surgeon: Lemar Lofty., MD;  Location: [facility] ENDOSCOPY;  Service: Gastroenterology;  Laterality: N/A;   ENDOSCOPIC MUCOSAL RESECTION N/A 04/27/2022   Procedure: ENDOSCOPIC MUCOSAL RESECTION;  Surgeon: Meridee Score Netty Starring., MD;  Location: [facility] ENDOSCOPY;  Service: Gastroenterology;  Laterality: N/A;   HEMOSTASIS CLIP PLACEMENT  04/27/2022   Procedure: HEMOSTASIS CLIP PLACEMENT;  Surgeon: Lemar Lofty., MD;  Location: [facility] ENDOSCOPY;  Service: Gastroenterology;;   LUMBAR FUSION  12/18/2004   POLYPECTOMY  04/27/2022   Procedure: POLYPECTOMY;  Surgeon: Lemar Lofty., MD;  Location: [facility] ENDOSCOPY;  Service: Gastroenterology;;   SUBMUCOSAL LIFTING INJECTION  04/27/2022   Procedure: SUBMUCOSAL LIFTING INJECTION;  Surgeon: Lemar Lofty., MD;  Location: [facility] ENDOSCOPY;  Service: Gastroenterology;;   TONSILLECTOMY  12/18/1960   TUBAL LIGATION  12/19/1987   Patient Active Problem List   Diagnosis Date Noted   Personal history of colonic polyps 07/08/2013   Nonspecific abnormal  electrocardiogram (ECG) (EKG) 06/24/2013   Multinodular goiter 06/24/2013   Herniated cervical disc 06/24/2013   Former smoker  35 year  declines screening chest CT  cessation 2008 06/24/2013   Family history of breast cancer in first degree relative 06/24/2013   Abnormal EKG 06/24/2013   Asthmatic bronchitis 01/06/2013   Allergic rhinitis 08/01/2012   Dyspareunia 08/01/2012   Symptoms, such as flushing, sleeplessness, headache, lack of concentration, associated with the menopause 08/01/2012   Hyperlipidemia 08/01/2012   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/01/2012   Abnormal Pap smear of cervix 08/01/2012    PCP: Nadyne Coombes MD   REFERRING PROVIDER: Madelyn Brunner, DO  REFERRING DIAG:  Diagnosis  M25.561 (ICD-10-CM) - Acute pain of right knee    THERAPY DIAG:  Acute pain of right knee  Muscle weakness (generalized)  Localized edema  Rationale for Evaluation and Treatment: Rehabilitation  ONSET DATE: May 1st 2024  SUBJECTIVE:   SUBJECTIVE STATEMENT: I was bowling with my office, had some good games but I was bowling a foul and my right knee gave out and I fell to the side. Felt like a strength issue but it happened fast so hard to say. Now its hard to bend the knee, going down stairs is harder than coming up stairs. Getting out of the car with that "twist and push" is hard. Still swelling but its better, its a little sore today too but I didn't my Advil either.   PERTINENT  HISTORY: Lumbar fusion 2006 PAIN:  Are you having pain? Yes: NPRS scale: 2-3/10 Pain location: R knee  Pain description: achey Aggravating factors: bending knee, going down steps, getting out of car, kneeling on R knee  Relieving factors: Advil   Pain at worst 3/10 recently   PRECAUTIONS: None  WEIGHT BEARING RESTRICTIONS: No  FALLS:  Has patient fallen in last 6 months? Yes. Number of falls 1 that caused injury, (+) FOF "I do take tumbles, had a really bad one a couple of years ago in [place]"    LIVING ENVIRONMENT: Lives with: lives with their spouse Lives in: House/apartment Stairs: 4 STE no rails but usually enters in basement and has full flight of steps to living room with rail Has following equipment at home: None  OCCUPATION: paralegal   PLOF: Independent, Independent with basic ADLs, Independent with gait, and Independent with transfers  PATIENT GOALS: get motion back in my knee, be able to get up and down (especially from low commode), be able to exercise pain free   NEXT MD VISIT: Dr. Shon Baton July 16th   OBJECTIVE:   DIAGNOSTIC FINDINGS: 4 views of the right knee including AP standing, Rosenberg, lateral and  sunrise views were ordered and reviewed by myself.  X-rays demonstrate  moderate patellofemoral arthralgia, there is relatively well-preserved  tibiofemoral joint space without significant arthritic change.  There is  chondrocalcinosis noted throughout the lateral greater than medial joint  line.  No acute fracture noted.   Limited musculoskeletal ultrasound of the right lower extremity, right  knee was performed today.  Evaluation shows overlying quadricep tendon  without pathology.  There is a small to moderate effusion in the  suprapatellar pouch of the knee.  The medial joint line shows some  calcification, likely chondrocalcinosis over the medial joint.  No  cortical irregularity of the medial femoral condyle noted.  Patellar  tendon seen intact with proper attachment and insertion on the inferior  patella to the tibial tubercle without evidence of tearing.   Impression: Small to moderate joint effusion with chondrocalcinosis noted  within the joint   PATIENT SURVEYS:  FOTO 57, predicted 69 in 12 visits   COGNITION: Overall cognitive status: Within functional limits for tasks assessed     SENSATION: Not tested  EDEMA:  Visible edema noted  MUSCLE LENGTH: HS WNL B Piriformis WNL B    PALPATION: No areas TTP around R knee, no muscle  spasms noted   LOWER EXTREMITY ROM:  Active ROM Right eval Left eval  Hip flexion    Hip extension    Hip abduction    Hip adduction    Hip internal rotation    Hip external rotation    Knee flexion 2* 1*  Knee extension 134* 132*  Ankle dorsiflexion    Ankle plantarflexion    Ankle inversion    Ankle eversion     (Blank rows = not tested)  LOWER EXTREMITY MMT:  MMT Right eval Left eval  Hip flexion 3 3  Hip extension 3 3  Hip abduction 3 3  Hip adduction    Hip internal rotation    Hip external rotation    Knee flexion 3+ 3+  Knee extension 4 4+  Ankle dorsiflexion 5 5  Ankle plantarflexion    Ankle inversion    Ankle eversion     (Blank rows = not tested)       TODAY'S TREATMENT:  DATE:    Eval  Objective measures, appropriate education, care planning  TherEx   Nustep L4x6 minutes BLEs only Bridge + ABD into red TB x10 Sidelying hip ABD red TB x10 B Standing HS curls red TB x10 B   PATIENT EDUCATION:  Education details: eval findings, HEP, POC Person educated: Patient Education method: Explanation, Demonstration, and Handouts Education comprehension: verbalized understanding, returned demonstration, and needs further education  HOME EXERCISE PROGRAM:  Access Code: [email or web address] URL: [email or web address] Date: 06/12/2023 Prepared by: Nedra Hai  Exercises - Supine Bridge with Resistance Band  - 2 x daily - 7 x weekly - 1 sets - 10 reps - 2 hold - Sidelying Hip Abduction with Resistance at Thighs  - 2 x daily - 7 x weekly - 1 sets - 10 reps - 1 hold - Standing Hamstring Curl with Resistance  - 2 x daily - 7 x weekly - 1 sets - 10 reps - 1 hold  ASSESSMENT:  CLINICAL IMPRESSION: Patient is a 67 y.o. F  who was seen today for physical therapy evaluation and treatment for R knee pain. Evaluation  primarily reveals significant functional muscle weakness and localized edema, as well as poor functional biomechanics likely related to strength impairments and contributing to pain. Will benefit from skilled PT services to address all impairments and optimize level of function moving forward.   OBJECTIVE IMPAIRMENTS: decreased strength, increased edema, and pain.   ACTIVITY LIMITATIONS: sitting, standing, squatting, sleeping, stairs, transfers, and locomotion level  PARTICIPATION LIMITATIONS: driving, shopping, community activity, occupation, and yard work  PERSONAL FACTORS: Age, Behavior pattern, Fitness, Past/current experiences, Social background, and Time since onset of injury/illness/exacerbation are also affecting patient's functional outcome.   REHAB POTENTIAL: Excellent  CLINICAL DECISION MAKING: Stable/uncomplicated  EVALUATION COMPLEXITY: Low   GOALS: Goals reviewed with patient? Yes  SHORT TERM GOALS: Target date: 07/03/2023   Will be compliant with appropriate progressive HEP  Baseline: Goal status: INITIAL  2.  R knee pain to be no more than 1/10 at worst  Baseline:  Goal status: INITIAL  3.  Will be compliant with appropriate edema management strategies (IE compression stocking, LE elevation, ice, etc) Baseline:  Goal status: INITIAL    LONG TERM GOALS: Target date: 07/24/2023    MMT to have improved by 1 grade in all weak groups  Baseline:  Goal status: INITIAL  2.  Will be able to ascend and descend stairs with no increase from resting pain R knee  Baseline:  Goal status: INITIAL  3.  Will be able to perform kneeling tasks and floor to stand with no increase from resting pain R knee  Baseline:  Goal status: INITIAL  4.  FOTO score to be within 5 points of goal  Baseline:  Goal status: INITIAL     PLAN:  PT FREQUENCY: 1-2x/week  PT DURATION: 6 weeks  PLANNED INTERVENTIONS: Therapeutic exercises, Therapeutic activity, Patient/Family  education, Self Care, Aquatic Therapy, Dry Needling, Taping, Ionotophoresis 4mg /ml Dexamethasone, Manual therapy, and Re-evaluation  PLAN FOR NEXT SESSION: focus on functional strengthening as appropriate   Nedra Hai, PT, DPT 06/12/23 4:46 PM

## 2023-06-18 ENCOUNTER — Encounter: Payer: Self-pay | Admitting: Physical Therapy

## 2023-06-18 ENCOUNTER — Ambulatory Visit: Payer: Medicare Other | Admitting: Physical Therapy

## 2023-06-18 DIAGNOSIS — M6281 Muscle weakness (generalized): Secondary | ICD-10-CM

## 2023-06-18 DIAGNOSIS — M25561 Pain in right knee: Secondary | ICD-10-CM | POA: Diagnosis not present

## 2023-06-18 DIAGNOSIS — R600 Localized edema: Secondary | ICD-10-CM

## 2023-06-18 NOTE — Therapy (Addendum)
OUTPATIENT PHYSICAL THERAPY LOWER EXTREMITY   Patient Name: Sonia Adams MRN: [id number] DOB:1956/09/08, 67 y.o., female Today's Date: 06/18/2023  END OF SESSION:  PT End of Session - 06/18/23 1400     Visit Number 2    Number of Visits 13    Date for PT Re-Evaluation 07/24/23    Authorization Type U[employer] MCR    Progress Note Due on Visit 10    PT Start Time 1348    PT Stop Time 1430    PT Time Calculation (min) 42 min    Activity Tolerance Patient tolerated treatment well    Behavior During Therapy WFL for tasks assessed/performed              Past Medical History:  Diagnosis Date   Abnormal pap    HSV-2 infection    Menopause    Past Surgical History:  Procedure Laterality Date   BASAL CELL CARCINOMA EXCISION     upper lip   COLONOSCOPY WITH PROPOFOL N/A 04/27/2022   Procedure: COLONOSCOPY WITH PROPOFOL;  Surgeon: Lemar Lofty., MD;  Location: [facility] ENDOSCOPY;  Service: Gastroenterology;  Laterality: N/A;   ENDOSCOPIC MUCOSAL RESECTION N/A 04/27/2022   Procedure: ENDOSCOPIC MUCOSAL RESECTION;  Surgeon: Meridee Score Netty Starring., MD;  Location: [facility] ENDOSCOPY;  Service: Gastroenterology;  Laterality: N/A;   HEMOSTASIS CLIP PLACEMENT  04/27/2022   Procedure: HEMOSTASIS CLIP PLACEMENT;  Surgeon: Lemar Lofty., MD;  Location: [facility] ENDOSCOPY;  Service: Gastroenterology;;   LUMBAR FUSION  12/18/2004   POLYPECTOMY  04/27/2022   Procedure: POLYPECTOMY;  Surgeon: Lemar Lofty., MD;  Location: [facility] ENDOSCOPY;  Service: Gastroenterology;;   SUBMUCOSAL LIFTING INJECTION  04/27/2022   Procedure: SUBMUCOSAL LIFTING INJECTION;  Surgeon: Lemar Lofty., MD;  Location: [facility] ENDOSCOPY;  Service: Gastroenterology;;   TONSILLECTOMY  12/18/1960   TUBAL LIGATION  12/19/1987   Patient Active Problem List   Diagnosis Date Noted   Personal history of colonic polyps 07/08/2013   Nonspecific abnormal electrocardiogram (ECG) (EKG) 06/24/2013   Multinodular goiter  06/24/2013   Herniated cervical disc 06/24/2013   Former smoker  35 year  declines screening chest CT  cessation 2008 06/24/2013   Family history of breast cancer in first degree relative 06/24/2013   Abnormal EKG 06/24/2013   Asthmatic bronchitis 01/06/2013   Allergic rhinitis 08/01/2012   Dyspareunia 08/01/2012   Symptoms, such as flushing, sleeplessness, headache, lack of concentration, associated with the menopause 08/01/2012   Hyperlipidemia 08/01/2012   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/01/2012   Abnormal Pap smear of cervix 08/01/2012    PCP: Nadyne Coombes MD   REFERRING PROVIDER: Madelyn Brunner, DO  REFERRING DIAG:  Diagnosis  M25.561 (ICD-10-CM) - Acute pain of right knee    THERAPY DIAG:  Acute pain of right knee  Muscle weakness (generalized)  Localized edema  Rationale for Evaluation and Treatment: Rehabilitation  ONSET DATE: May 1st 2024  SUBJECTIVE:   SUBJECTIVE STATEMENT: Pt arriving today reporting 3/10 pain in her Rt knee. Pt stating yesterday it was more like 5-6/10.   PERTINENT HISTORY: Lumbar fusion 2006 PAIN:  Are you having pain? Yes: NPRS scale: 2-3/10 Pain location: R knee  Pain description: achey Aggravating factors: bending knee, going down steps, getting out of car, kneeling on R knee  Relieving factors: Advil   Pain at worst 3/10 recently   PRECAUTIONS: None  WEIGHT BEARING RESTRICTIONS: No  FALLS:  Has patient fallen in last 6 months? Yes. Number of falls 1 that caused injury, (+) FOF "  I do take tumbles, had a really bad one a couple of years ago in [place]"   LIVING ENVIRONMENT: Lives with: lives with their spouse Lives in: House/apartment Stairs: 4 STE no rails but usually enters in basement and has full flight of steps to living room with rail Has following equipment at home: None  OCCUPATION: paralegal   PLOF: Independent, Independent with basic ADLs, Independent with gait, and Independent with transfers  PATIENT GOALS:  get motion back in my knee, be able to get up and down (especially from low commode), be able to exercise pain free   NEXT MD VISIT: Dr. Shon Baton July 16th   OBJECTIVE:   DIAGNOSTIC FINDINGS: 4 views of the right knee including AP standing, Rosenberg, lateral and  sunrise views were ordered and reviewed by myself.  X-rays demonstrate  moderate patellofemoral arthralgia, there is relatively well-preserved  tibiofemoral joint space without significant arthritic change.  There is  chondrocalcinosis noted throughout the lateral greater than medial joint  line.  No acute fracture noted.   Limited musculoskeletal ultrasound of the right lower extremity, right  knee was performed today.  Evaluation shows overlying quadricep tendon  without pathology.  There is a small to moderate effusion in the  suprapatellar pouch of the knee.  The medial joint line shows some  calcification, likely chondrocalcinosis over the medial joint.  No  cortical irregularity of the medial femoral condyle noted.  Patellar  tendon seen intact with proper attachment and insertion on the inferior  patella to the tibial tubercle without evidence of tearing.   Impression: Small to moderate joint effusion with chondrocalcinosis noted  within the joint   PATIENT SURVEYS:  FOTO 57, predicted 69 in 12 visits   COGNITION: Overall cognitive status: Within functional limits for tasks assessed     SENSATION: Not tested  EDEMA:  Visible edema noted  MUSCLE LENGTH: HS WNL B Piriformis WNL B    PALPATION: No areas TTP around R knee, no muscle spasms noted   LOWER EXTREMITY ROM:  Active ROM Right eval Left eval  Hip flexion    Hip extension    Hip abduction    Hip adduction    Hip internal rotation    Hip external rotation    Knee flexion 2* 1*  Knee extension 134* 132*  Ankle dorsiflexion    Ankle plantarflexion    Ankle inversion    Ankle eversion     (Blank rows = not tested)  LOWER EXTREMITY  MMT:  MMT Right eval Left eval  Hip flexion 3 3  Hip extension 3 3  Hip abduction 3 3  Hip adduction    Hip internal rotation    Hip external rotation    Knee flexion 3+ 3+  Knee extension 4 4+  Ankle dorsiflexion 5 5  Ankle plantarflexion    Ankle inversion    Ankle eversion     (Blank rows = not tested)       TODAY'S TREATMENT:  06/18/23:  TherEx:  Nustep: level 5 x 5 minutes Calf stretch on slant board x 2 holding 30 sec Seated SLR: 2x 10  LAQ: 3# 2 x 10  Bridges: 2 x 10 holding 5 sec Sidelying hip abd 2 x 10  Supine hamstring stretches: x 3 holding 30 seconds      Eval  Objective measures, appropriate education, care planning  TherEx   Nustep L4x6 minutes BLEs only Bridge + ABD into red TB x10 Sidelying hip ABD red TB x10 B Standing HS curls red TB x10 B   PATIENT EDUCATION:  Education details: eval findings, HEP, POC Person educated: Patient Education method: Explanation, Demonstration, and Handouts Education comprehension: verbalized understanding, returned demonstration, and needs further education  HOME EXERCISE PROGRAM:  Access Code: [email or web address]  URL: [email or web address] Date: 06/18/2023 Prepared by: Narda Amber  Exercises - Supine Bridge with Resistance Band  - 2 x daily - 7 x weekly - 1 sets - 10 reps - 2 hold - Sidelying Hip Abduction with Resistance at Thighs  - 2 x daily - 7 x weekly - 1 sets - 10 reps - 1 hold - Standing Hamstring Curl with Resistance  - 2 x daily - 7 x weekly - 1 sets - 10 reps - 1 hold - Seated Hamstring Stretch  - 1-2 x daily - 7 x weekly - 3 reps - 30 seconds hold  ASSESSMENT:  CLINICAL IMPRESSION: Pt arriving to therapy reporting 2-3/10 pain in her Rt knee. Pt tolerating all exercises well. Pt's HEP was reviewed and pt able to demonstrate compliance. Continue with skilled  PT for LE strengthening and improvements in functional mobility.   OBJECTIVE IMPAIRMENTS: decreased strength, increased edema, and pain.   ACTIVITY LIMITATIONS: sitting, standing, squatting, sleeping, stairs, transfers, and locomotion level  PARTICIPATION LIMITATIONS: driving, shopping, community activity, occupation, and yard work  PERSONAL FACTORS: Age, Behavior pattern, Fitness, Past/current experiences, Social background, and Time since onset of injury/illness/exacerbation are also affecting patient's functional outcome.   REHAB POTENTIAL: Excellent  CLINICAL DECISION MAKING: Stable/uncomplicated  EVALUATION COMPLEXITY: Low   GOALS: Goals reviewed with patient? Yes  SHORT TERM GOALS: Target date: 07/03/2023   Will be compliant with appropriate progressive HEP  Baseline: Goal status: On-going 06/18/23  2.  R knee pain to be no more than 1/10 at worst  Baseline:  Goal status: On-going 06/18/23  3.  Will be compliant with appropriate edema management strategies (IE compression stocking, LE elevation, ice, etc) Baseline:  Goal status: INITIAL    LONG TERM GOALS: Target date: 07/24/2023    MMT to have improved by 1 grade in all weak groups  Baseline:  Goal status: INITIAL  2.  Will be able to ascend and descend stairs with no increase from resting pain R knee  Baseline:  Goal status: INITIAL  3.  Will be able to perform kneeling tasks and floor to stand with no increase from resting pain R knee  Baseline:  Goal status: INITIAL  4.  FOTO score to be within 5 points of goal  Baseline:  Goal status: INITIAL     PLAN:  PT FREQUENCY: 1-2x/week  PT DURATION: 6 weeks  PLANNED INTERVENTIONS: Therapeutic exercises, Therapeutic activity, Patient/Family education, Self Care, Aquatic Therapy, Dry Needling, Taping, Ionotophoresis 4mg /ml Dexamethasone, Manual therapy, and Re-evaluation  PLAN FOR NEXT SESSION: focus on functional strengthening as appropriate   Narda Amber, PT, MPT 06/18/23 2:20 PM   06/18/23 2:20 PM

## 2023-06-27 ENCOUNTER — Ambulatory Visit: Payer: Medicare Other | Admitting: Physical Therapy

## 2023-06-27 ENCOUNTER — Encounter: Payer: Self-pay | Admitting: Physical Therapy

## 2023-06-27 DIAGNOSIS — M25561 Pain in right knee: Secondary | ICD-10-CM | POA: Diagnosis not present

## 2023-06-27 DIAGNOSIS — R600 Localized edema: Secondary | ICD-10-CM

## 2023-06-27 DIAGNOSIS — M6281 Muscle weakness (generalized): Secondary | ICD-10-CM | POA: Diagnosis not present

## 2023-06-27 NOTE — Therapy (Signed)
OUTPATIENT PHYSICAL THERAPY LOWER EXTREMITY TREATMENT   Patient Name: Sonia Adams MRN: [id number] DOB:1956/04/11, 67 y.o., female Today's Date: 06/27/2023  END OF SESSION:  PT End of Session - 06/27/23 1528     Visit Number 3    Number of Visits 13    Date for PT Re-Evaluation 07/24/23    Authorization Type U[employer] MCR    Authorization Time Period 06/12/23 to 07/24/23    Progress Note Due on Visit 10    PT Start Time 1517    PT Stop Time 1558    PT Time Calculation (min) 41 min    Activity Tolerance Patient tolerated treatment well    Behavior During Therapy WFL for tasks assessed/performed               Past Medical History:  Diagnosis Date   Abnormal pap    HSV-2 infection    Menopause    Past Surgical History:  Procedure Laterality Date   BASAL CELL CARCINOMA EXCISION     upper lip   COLONOSCOPY WITH PROPOFOL N/A 04/27/2022   Procedure: COLONOSCOPY WITH PROPOFOL;  Surgeon: Lemar Lofty., MD;  Location: [facility] ENDOSCOPY;  Service: Gastroenterology;  Laterality: N/A;   ENDOSCOPIC MUCOSAL RESECTION N/A 04/27/2022   Procedure: ENDOSCOPIC MUCOSAL RESECTION;  Surgeon: Meridee Score Netty Starring., MD;  Location: [facility] ENDOSCOPY;  Service: Gastroenterology;  Laterality: N/A;   HEMOSTASIS CLIP PLACEMENT  04/27/2022   Procedure: HEMOSTASIS CLIP PLACEMENT;  Surgeon: Lemar Lofty., MD;  Location: [facility] ENDOSCOPY;  Service: Gastroenterology;;   LUMBAR FUSION  12/18/2004   POLYPECTOMY  04/27/2022   Procedure: POLYPECTOMY;  Surgeon: Lemar Lofty., MD;  Location: [facility] ENDOSCOPY;  Service: Gastroenterology;;   SUBMUCOSAL LIFTING INJECTION  04/27/2022   Procedure: SUBMUCOSAL LIFTING INJECTION;  Surgeon: Lemar Lofty., MD;  Location: [facility] ENDOSCOPY;  Service: Gastroenterology;;   TONSILLECTOMY  12/18/1960   TUBAL LIGATION  12/19/1987   Patient Active Problem List   Diagnosis Date Noted   Personal history of colonic polyps 07/08/2013   Nonspecific abnormal  electrocardiogram (ECG) (EKG) 06/24/2013   Multinodular goiter 06/24/2013   Herniated cervical disc 06/24/2013   Former smoker  35 year  declines screening chest CT  cessation 2008 06/24/2013   Family history of breast cancer in first degree relative 06/24/2013   Abnormal EKG 06/24/2013   Asthmatic bronchitis 01/06/2013   Allergic rhinitis 08/01/2012   Dyspareunia 08/01/2012   Symptoms, such as flushing, sleeplessness, headache, lack of concentration, associated with the menopause 08/01/2012   Hyperlipidemia 08/01/2012   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/01/2012   Abnormal Pap smear of cervix 08/01/2012    PCP: Nadyne Coombes MD   REFERRING PROVIDER: Madelyn Brunner, DO  REFERRING DIAG:  Diagnosis  M25.561 (ICD-10-CM) - Acute pain of right knee    THERAPY DIAG:  Acute pain of right knee  Muscle weakness (generalized)  Localized edema  Rationale for Evaluation and Treatment: Rehabilitation  ONSET DATE: May 1st 2024  SUBJECTIVE:   SUBJECTIVE STATEMENT:  I'm feeling much better than I was before, I was able to skip down the stairs when I left work today no problems. Bending and twisting at the same time can make it hurt, getting up from a kneeling is hard too.  PERTINENT HISTORY: Lumbar fusion 2006 PAIN:  Are you having pain? Yes: NPRS scale: 0/10 now 2/10 at worst in the past week/10 Pain location: R knee  Pain description: achey Aggravating factors: bending knee, going down steps, getting out of car, kneeling  on R knee  Relieving factors: Advil   Pain at worst 2/10 recently   PRECAUTIONS: None  WEIGHT BEARING RESTRICTIONS: No  FALLS:  Has patient fallen in last 6 months? Yes. Number of falls 1 that caused injury, (+) FOF "I do take tumbles, had a really bad one a couple of years ago in [place]"   LIVING ENVIRONMENT: Lives with: lives with their spouse Lives in: House/apartment Stairs: 4 STE no rails but usually enters in basement and has full flight of steps  to living room with rail Has following equipment at home: None  OCCUPATION: paralegal   PLOF: Independent, Independent with basic ADLs, Independent with gait, and Independent with transfers  PATIENT GOALS: get motion back in my knee, be able to get up and down (especially from low commode), be able to exercise pain free   NEXT MD VISIT: Dr. Shon Baton July 16th   OBJECTIVE:   DIAGNOSTIC FINDINGS: 4 views of the right knee including AP standing, Rosenberg, lateral and  sunrise views were ordered and reviewed by myself.  X-rays demonstrate  moderate patellofemoral arthralgia, there is relatively well-preserved  tibiofemoral joint space without significant arthritic change.  There is  chondrocalcinosis noted throughout the lateral greater than medial joint  line.  No acute fracture noted.   Limited musculoskeletal ultrasound of the right lower extremity, right  knee was performed today.  Evaluation shows overlying quadricep tendon  without pathology.  There is a small to moderate effusion in the  suprapatellar pouch of the knee.  The medial joint line shows some  calcification, likely chondrocalcinosis over the medial joint.  No  cortical irregularity of the medial femoral condyle noted.  Patellar  tendon seen intact with proper attachment and insertion on the inferior  patella to the tibial tubercle without evidence of tearing.   Impression: Small to moderate joint effusion with chondrocalcinosis noted  within the joint   PATIENT SURVEYS:  FOTO 57, predicted 69 in 12 visits   COGNITION: Overall cognitive status: Within functional limits for tasks assessed     SENSATION: Not tested  EDEMA:  Visible edema noted  MUSCLE LENGTH: HS WNL B Piriformis WNL B    PALPATION: No areas TTP around R knee, no muscle spasms noted   LOWER EXTREMITY ROM:  Active ROM Right eval Left eval  Hip flexion    Hip extension    Hip abduction    Hip adduction    Hip internal rotation     Hip external rotation    Knee flexion 2* 1*  Knee extension 134* 132*  Ankle dorsiflexion    Ankle plantarflexion    Ankle inversion    Ankle eversion     (Blank rows = not tested)  LOWER EXTREMITY MMT:  MMT Right eval Left eval  Hip flexion 3 3  Hip extension 3 3  Hip abduction 3 3  Hip adduction    Hip internal rotation    Hip external rotation    Knee flexion 3+ 3+  Knee extension 4 4+  Ankle dorsiflexion 5 5  Ankle plantarflexion    Ankle inversion    Ankle eversion     (Blank rows = not tested)       TODAY'S TREATMENT:     06/27/23  TherEx  Bridges + ABD into green TB x10 Walking bridges x6 Quadruped knee ABD x5 B green TB Prone HS curls green TB x10 B Single leg STS from 28 inch mat table x10 B (alternating) Forward step  ups 6 inch box x10 B focus on slow control  Elevated lunges onto 6 inch x10 B (alternating) Shuttle leg press 50# x10, 25# x10 B single leg                                                                                                                              06/18/23:  TherEx:  Nustep: level 5 x 5 minutes Calf stretch on slant board x 2 holding 30 sec Seated SLR: 2x 10  LAQ: 3# 2 x 10  Bridges: 2 x 10 holding 5 sec Sidelying hip abd 2 x 10  Supine hamstring stretches: x 3 holding 30 seconds      Eval  Objective measures, appropriate education, care planning  TherEx   Nustep L4x6 minutes BLEs only Bridge + ABD into red TB x10 Sidelying hip ABD red TB x10 B Standing HS curls red TB x10 B   PATIENT EDUCATION:  Education details: eval findings, HEP, POC Person educated: Patient Education method: Explanation, Demonstration, and Handouts Education comprehension: verbalized understanding, returned demonstration, and needs further education  HOME EXERCISE PROGRAM:  Access Code: [email or web address] URL: [email or web address] Date: 06/27/2023 Prepared by: Nedra Hai  Exercises - Supine Bridge with  Resistance Band  - 2 x daily - 7 x weekly - 1 sets - 10 reps - 2 hold - Sidelying Hip Abduction with Resistance at Thighs  - 2 x daily - 7 x weekly - 1 sets - 10 reps - 1 hold - Standing Hamstring Curl with Resistance  - 2 x daily - 7 x weekly - 1 sets - 10 reps - 1 hold - Seated Hamstring Stretch  - 1-2 x daily - 7 x weekly - 3 reps - 30 seconds hold - Bridge Walk Out  - 1-2 x daily - 7 x weekly - 1 sets - 5-10 reps - Prone Hamstring Curl with Anchored Resistance  - 1-2 x daily - 7 x weekly - 1 sets - 10 reps - 1 second  hold - Quadruped Hip Abduction with Resistance Loop  - 1-2 x daily - 7 x weekly - 1 sets - 5-10 reps - 1 second  hold  ASSESSMENT:  CLINICAL IMPRESSION:  Ms. Rothgeb arrives today very pleased, she's had a significant improvement in pain and functional tasks such as stair navigation have become much easier. Still having some pain with combined movements that involve bending/twisting  (like getting out of the car) or getting up from kneeling. I continue to feel that she will feel a lot better as we build her muscle strength and endurance. Happy to see her feeling so much better, we will continue to progress as tolerated.   OBJECTIVE IMPAIRMENTS: decreased strength, increased edema, and pain.   ACTIVITY LIMITATIONS: sitting, standing, squatting, sleeping, stairs, transfers, and locomotion level  PARTICIPATION LIMITATIONS: driving, shopping, community activity, occupation, and yard work  PERSONAL FACTORS: Age, Behavior pattern, Fitness, Past/current experiences, Social background, and Time since  onset of injury/illness/exacerbation are also affecting patient's functional outcome.   REHAB POTENTIAL: Excellent  CLINICAL DECISION MAKING: Stable/uncomplicated  EVALUATION COMPLEXITY: Low   GOALS: Goals reviewed with patient? Yes  SHORT TERM GOALS: Target date: 07/03/2023   Will be compliant with appropriate progressive HEP  Baseline: Goal status: On-going 06/18/23  2.  R  knee pain to be no more than 1/10 at worst  Baseline:  Goal status: On-going 06/18/23  3.  Will be compliant with appropriate edema management strategies (IE compression stocking, LE elevation, ice, etc) Baseline:  Goal status: INITIAL    LONG TERM GOALS: Target date: 07/24/2023    MMT to have improved by 1 grade in all weak groups  Baseline:  Goal status: INITIAL  2.  Will be able to ascend and descend stairs with no increase from resting pain R knee  Baseline:  Goal status: INITIAL  3.  Will be able to perform kneeling tasks and floor to stand with no increase from resting pain R knee  Baseline:  Goal status: INITIAL  4.  FOTO score to be within 5 points of goal  Baseline:  Goal status: INITIAL     PLAN:  PT FREQUENCY: 1-2x/week  PT DURATION: 6 weeks  PLANNED INTERVENTIONS: Therapeutic exercises, Therapeutic activity, Patient/Family education, Self Care, Aquatic Therapy, Dry Needling, Taping, Ionotophoresis 4mg /ml Dexamethasone, Manual therapy, and Re-evaluation  PLAN FOR NEXT SESSION: focus on functional strengthening as appropriate. Progress note prior to upcoming MD appt next visit   Nedra Hai, PT, DPT 06/27/23 4:01 PM

## 2023-06-29 ENCOUNTER — Ambulatory Visit: Payer: Medicare Other | Admitting: Physical Therapy

## 2023-06-29 ENCOUNTER — Encounter: Payer: Self-pay | Admitting: Physical Therapy

## 2023-06-29 DIAGNOSIS — R600 Localized edema: Secondary | ICD-10-CM | POA: Diagnosis not present

## 2023-06-29 DIAGNOSIS — M25561 Pain in right knee: Secondary | ICD-10-CM

## 2023-06-29 DIAGNOSIS — M6281 Muscle weakness (generalized): Secondary | ICD-10-CM | POA: Diagnosis not present

## 2023-06-29 NOTE — Therapy (Signed)
OUTPATIENT PHYSICAL THERAPY LOWER EXTREMITY TREATMENT/PROGRESS NOTE   Patient Name: Sonia Adams MRN: [id number] DOB:04/21/1956, 67 y.o., female Today's Date: 06/29/2023  END OF SESSION:  PT End of Session - 06/29/23 1526     Visit Number 4    Number of Visits 13    Date for PT Re-Evaluation 07/24/23    Authorization Type UHC MCR    Authorization Time Period 06/12/23 to 07/24/23    Progress Note Due on Visit 10    PT Start Time 1518    PT Stop Time 1559    PT Time Calculation (min) 41 min    Activity Tolerance Patient tolerated treatment well    Behavior During Therapy [facility] for tasks assessed/performed                Past Medical History:  Diagnosis Date   Abnormal pap    HSV-2 infection    Menopause    Past Surgical History:  Procedure Laterality Date   BASAL CELL CARCINOMA EXCISION     upper lip   COLONOSCOPY WITH PROPOFOL N/A 04/27/2022   Procedure: COLONOSCOPY WITH PROPOFOL;  Surgeon: Lemar Lofty., MD;  Location: [facility] ENDOSCOPY;  Service: Gastroenterology;  Laterality: N/A;   ENDOSCOPIC MUCOSAL RESECTION N/A 04/27/2022   Procedure: ENDOSCOPIC MUCOSAL RESECTION;  Surgeon: Meridee Score Netty Starring., MD;  Location: [facility] ENDOSCOPY;  Service: Gastroenterology;  Laterality: N/A;   HEMOSTASIS CLIP PLACEMENT  04/27/2022   Procedure: HEMOSTASIS CLIP PLACEMENT;  Surgeon: Lemar Lofty., MD;  Location: [facility] ENDOSCOPY;  Service: Gastroenterology;;   LUMBAR FUSION  12/18/2004   POLYPECTOMY  04/27/2022   Procedure: POLYPECTOMY;  Surgeon: Lemar Lofty., MD;  Location: [facility] ENDOSCOPY;  Service: Gastroenterology;;   SUBMUCOSAL LIFTING INJECTION  04/27/2022   Procedure: SUBMUCOSAL LIFTING INJECTION;  Surgeon: Lemar Lofty., MD;  Location: [facility] ENDOSCOPY;  Service: Gastroenterology;;   TONSILLECTOMY  12/18/1960   TUBAL LIGATION  12/19/1987   Patient Active Problem List   Diagnosis Date Noted   Personal history of colonic polyps 07/08/2013    Nonspecific abnormal electrocardiogram (ECG) (EKG) 06/24/2013   Multinodular goiter 06/24/2013   Herniated cervical disc 06/24/2013   Former smoker  35 year  declines screening chest CT  cessation 2008 06/24/2013   Family history of breast cancer in first degree relative 06/24/2013   Abnormal EKG 06/24/2013   Asthmatic bronchitis 01/06/2013   Allergic rhinitis 08/01/2012   Dyspareunia 08/01/2012   Symptoms, such as flushing, sleeplessness, headache, lack of concentration, associated with the menopause 08/01/2012   Hyperlipidemia 08/01/2012   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/01/2012   Abnormal Pap smear of cervix 08/01/2012    PCP: Nadyne Coombes MD   REFERRING PROVIDER: Madelyn Brunner, DO  REFERRING DIAG:  Diagnosis  M25.561 (ICD-10-CM) - Acute pain of right knee    THERAPY DIAG:  Acute pain of right knee  Muscle weakness (generalized)  Localized edema  Rationale for Evaluation and Treatment: Rehabilitation  ONSET DATE: May 1st 2024  SUBJECTIVE:   SUBJECTIVE STATEMENT:  I was really sore after the last session we did, more like that work out feeling from the gym than anything else. Not pain, just sore.   PERTINENT HISTORY: Lumbar fusion 2006 PAIN:  Are you having pain? Yes: NPRS scale: 1/10 Pain location: generalized muscle soreness  Pain description: soreness Aggravating factors: unsure  Relieving factors: unsure  Pain at worst 2/10 recently   PRECAUTIONS: None  WEIGHT BEARING RESTRICTIONS: No  FALLS:  Has patient fallen in last 6 months?  Yes. Number of falls 1 that caused injury, (+) FOF "I do take tumbles, had a really bad one a couple of years ago in [place]"   LIVING ENVIRONMENT: Lives with: lives with their spouse Lives in: House/apartment Stairs: 4 STE no rails but usually enters in basement and has full flight of steps to living room with rail Has following equipment at home: None  OCCUPATION: paralegal   PLOF: Independent, Independent with  basic ADLs, Independent with gait, and Independent with transfers  PATIENT GOALS: get motion back in my knee, be able to get up and down (especially from low commode), be able to exercise pain free   NEXT MD VISIT: Dr. Shon Baton July 16th   OBJECTIVE:   DIAGNOSTIC FINDINGS: 4 views of the right knee including AP standing, Rosenberg, lateral and  sunrise views were ordered and reviewed by myself.  X-rays demonstrate  moderate patellofemoral arthralgia, there is relatively well-preserved  tibiofemoral joint space without significant arthritic change.  There is  chondrocalcinosis noted throughout the lateral greater than medial joint  line.  No acute fracture noted.   Limited musculoskeletal ultrasound of the right lower extremity, right  knee was performed today.  Evaluation shows overlying quadricep tendon  without pathology.  There is a small to moderate effusion in the  suprapatellar pouch of the knee.  The medial joint line shows some  calcification, likely chondrocalcinosis over the medial joint.  No  cortical irregularity of the medial femoral condyle noted.  Patellar  tendon seen intact with proper attachment and insertion on the inferior  patella to the tibial tubercle without evidence of tearing.   Impression: Small to moderate joint effusion with chondrocalcinosis noted  within the joint   PATIENT SURVEYS:  FOTO 57, predicted 69 in 12 visits   COGNITION: Overall cognitive status: Within functional limits for tasks assessed     SENSATION: Not tested  EDEMA:  Visible edema noted  MUSCLE LENGTH: HS WNL B Piriformis WNL B    PALPATION: No areas TTP around R knee, no muscle spasms noted   LOWER EXTREMITY ROM:  Active ROM Right eval Left eval  Hip flexion    Hip extension    Hip abduction    Hip adduction    Hip internal rotation    Hip external rotation    Knee flexion 2* 1*  Knee extension 134* 132*  Ankle dorsiflexion    Ankle plantarflexion    Ankle  inversion    Ankle eversion     (Blank rows = not tested)  LOWER EXTREMITY MMT:  MMT Right eval Left eval Right 06/29/23 Left 06/29/23  Hip flexion 3 3 4 4   Hip extension 3 3 3 4   Hip abduction 3 3 4+ 4  Hip adduction      Hip internal rotation      Hip external rotation      Knee flexion 3+ 3+ 4- 4+  Knee extension 4 4+ 4+ 4+  Ankle dorsiflexion 5 5    Ankle plantarflexion      Ankle inversion      Ankle eversion       (Blank rows = not tested)       TODAY'S TREATMENT:    06/29/23  Objective measures, appropriate measures, further care planning  TherEx  Nustep L1 x6 minutes BLEs for warmup/active recovery   Staggered bridge x10 B Hip flexor/quad stretch off edge of mat table 2x30 seconds B Sidelying hip ABD x10 B Seated HS stretches 2x30 seconds  B     06/27/23  TherEx  Bridges + ABD into green TB x10 Walking bridges x6 Quadruped knee ABD x5 B green TB Prone HS curls green TB x10 B Single leg STS from 28 inch mat table x10 B (alternating) Forward step ups 6 inch box x10 B focus on slow control  Elevated lunges onto 6 inch x10 B (alternating) Shuttle leg press 50# x10, 25# x10 B single leg                                                                                                                              06/18/23:  TherEx:  Nustep: level 5 x 5 minutes Calf stretch on slant board x 2 holding 30 sec Seated SLR: 2x 10  LAQ: 3# 2 x 10  Bridges: 2 x 10 holding 5 sec Sidelying hip abd 2 x 10  Supine hamstring stretches: x 3 holding 30 seconds      Eval  Objective measures, appropriate education, care planning  TherEx   Nustep L4x6 minutes BLEs only Bridge + ABD into red TB x10 Sidelying hip ABD red TB x10 B Standing HS curls red TB x10 B   PATIENT EDUCATION:  Education details: eval findings, HEP, POC Person educated: Patient Education method: Explanation, Demonstration, and Handouts Education comprehension: verbalized  understanding, returned demonstration, and needs further education  HOME EXERCISE PROGRAM:  Access Code: [email or web address] URL: [email or web address] Date: 06/27/2023 Prepared by: Nedra Hai  Exercises - Supine Bridge with Resistance Band  - 2 x daily - 7 x weekly - 1 sets - 10 reps - 2 hold - Sidelying Hip Abduction with Resistance at Thighs  - 2 x daily - 7 x weekly - 1 sets - 10 reps - 1 hold - Standing Hamstring Curl with Resistance  - 2 x daily - 7 x weekly - 1 sets - 10 reps - 1 hold - Seated Hamstring Stretch  - 1-2 x daily - 7 x weekly - 3 reps - 30 seconds hold - Bridge Walk Out  - 1-2 x daily - 7 x weekly - 1 sets - 5-10 reps - Prone Hamstring Curl with Anchored Resistance  - 1-2 x daily - 7 x weekly - 1 sets - 10 reps - 1 second  hold - Quadruped Hip Abduction with Resistance Loop  - 1-2 x daily - 7 x weekly - 1 sets - 5-10 reps - 1 second  hold  ASSESSMENT:  CLINICAL IMPRESSION:  Ms. Hedgepeth arrives today doing OK, still having some residual muscle soreness after more intense workout last session. Took some objective measures, then focused today's session on active recovery and flexibility to assist in managing DOMS symptoms. Progressing well overall, will continue to challenge her functional strength and work towards goals as able/tolerated.   OBJECTIVE IMPAIRMENTS: decreased strength, increased edema, and pain.   ACTIVITY LIMITATIONS: sitting, standing, squatting, sleeping, stairs, transfers, and locomotion level  PARTICIPATION LIMITATIONS: driving, shopping, community  activity, occupation, and yard work  PERSONAL FACTORS: Age, Behavior pattern, Fitness, Past/current experiences, Social background, and Time since onset of injury/illness/exacerbation are also affecting patient's functional outcome.   REHAB POTENTIAL: Excellent  CLINICAL DECISION MAKING: Stable/uncomplicated  EVALUATION COMPLEXITY: Low   GOALS: Goals reviewed with patient? Yes  SHORT TERM  GOALS: Target date: 07/03/2023   Will be compliant with appropriate progressive HEP  Baseline: Goal status: MET  06/29/23  2.  R knee pain to be no more than 1/10 at worst  Baseline:  Goal status:  ONGOING 06/29/23  3.  Will be compliant with appropriate edema management strategies (IE compression stocking, LE elevation, ice, etc) Baseline:  Goal status: ONGOING 7/12    LONG TERM GOALS: Target date: 07/24/2023    MMT to have improved by 1 grade in all weak groups  Baseline:  Goal status: INITIAL  2.  Will be able to ascend and descend stairs with no increase from resting pain R knee  Baseline:  Goal status: INITIAL  3.  Will be able to perform kneeling tasks and floor to stand with no increase from resting pain R knee  Baseline:  Goal status: INITIAL  4.  FOTO score to be within 5 points of goal  Baseline:  Goal status: INITIAL     PLAN:  PT FREQUENCY: 1-2x/week  PT DURATION: 6 weeks  PLANNED INTERVENTIONS: Therapeutic exercises, Therapeutic activity, Patient/Family education, Self Care, Aquatic Therapy, Dry Needling, Taping, Ionotophoresis 4mg /ml Dexamethasone, Manual therapy, and Re-evaluation  PLAN FOR NEXT SESSION: focus on functional strengthening as appropriate.   Nedra Hai, PT, DPT 06/29/23 4:00 PM

## 2023-07-03 ENCOUNTER — Ambulatory Visit: Payer: Medicare Other | Admitting: Sports Medicine

## 2023-07-10 ENCOUNTER — Encounter: Payer: Self-pay | Admitting: Physical Therapy

## 2023-07-10 ENCOUNTER — Ambulatory Visit: Payer: Medicare Other | Admitting: Physical Therapy

## 2023-07-10 DIAGNOSIS — M25561 Pain in right knee: Secondary | ICD-10-CM | POA: Diagnosis not present

## 2023-07-10 DIAGNOSIS — M6281 Muscle weakness (generalized): Secondary | ICD-10-CM

## 2023-07-10 DIAGNOSIS — R600 Localized edema: Secondary | ICD-10-CM | POA: Diagnosis not present

## 2023-07-10 NOTE — Therapy (Signed)
OUTPATIENT PHYSICAL THERAPY LOWER EXTREMITY TREATMENT/PROGRESS NOTE   Patient Name: Sonia Adams MRN: [id number] DOB:05-11-1956, 67 y.o., female Today's Date: 07/10/2023  END OF SESSION:  PT End of Session - 07/10/23 0801     Visit Number 5    Number of Visits 13    Date for PT Re-Evaluation 07/24/23    Authorization Type U[employer] MCR    Authorization Time Period 06/12/23 to 07/24/23    Progress Note Due on Visit 10    PT Start Time 0800    PT Stop Time 0840    PT Time Calculation (min) 40 min    Activity Tolerance Patient tolerated treatment well    Behavior During Therapy [facility] for tasks assessed/performed                Past Medical History:  Diagnosis Date   Abnormal pap    HSV-2 infection    Menopause    Past Surgical History:  Procedure Laterality Date   BASAL CELL CARCINOMA EXCISION     upper lip   COLONOSCOPY WITH PROPOFOL N/A 04/27/2022   Procedure: COLONOSCOPY WITH PROPOFOL;  Surgeon: Lemar Lofty., MD;  Location: [facility] ENDOSCOPY;  Service: Gastroenterology;  Laterality: N/A;   ENDOSCOPIC MUCOSAL RESECTION N/A 04/27/2022   Procedure: ENDOSCOPIC MUCOSAL RESECTION;  Surgeon: Meridee Score Netty Starring., MD;  Location: [facility] ENDOSCOPY;  Service: Gastroenterology;  Laterality: N/A;   HEMOSTASIS CLIP PLACEMENT  04/27/2022   Procedure: HEMOSTASIS CLIP PLACEMENT;  Surgeon: Lemar Lofty., MD;  Location: [facility] ENDOSCOPY;  Service: Gastroenterology;;   LUMBAR FUSION  12/18/2004   POLYPECTOMY  04/27/2022   Procedure: POLYPECTOMY;  Surgeon: Lemar Lofty., MD;  Location: [facility] ENDOSCOPY;  Service: Gastroenterology;;   SUBMUCOSAL LIFTING INJECTION  04/27/2022   Procedure: SUBMUCOSAL LIFTING INJECTION;  Surgeon: Lemar Lofty., MD;  Location: [facility] ENDOSCOPY;  Service: Gastroenterology;;   TONSILLECTOMY  12/18/1960   TUBAL LIGATION  12/19/1987   Patient Active Problem List   Diagnosis Date Noted   Personal history of colonic polyps 07/08/2013    Nonspecific abnormal electrocardiogram (ECG) (EKG) 06/24/2013   Multinodular goiter 06/24/2013   Herniated cervical disc 06/24/2013   Former smoker  35 year  declines screening chest CT  cessation 2008 06/24/2013   Family history of breast cancer in first degree relative 06/24/2013   Abnormal EKG 06/24/2013   Asthmatic bronchitis 01/06/2013   Allergic rhinitis 08/01/2012   Dyspareunia 08/01/2012   Symptoms, such as flushing, sleeplessness, headache, lack of concentration, associated with the menopause 08/01/2012   Hyperlipidemia 08/01/2012   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/01/2012   Abnormal Pap smear of cervix 08/01/2012    PCP: Nadyne Coombes MD   REFERRING PROVIDER: Madelyn Brunner, DO  REFERRING DIAG:  Diagnosis  M25.561 (ICD-10-CM) - Acute pain of right knee    THERAPY DIAG:  Acute pain of right knee  Muscle weakness (generalized)  Localized edema  Rationale for Evaluation and Treatment: Rehabilitation  ONSET DATE: May 1st 2024  SUBJECTIVE:   SUBJECTIVE STATEMENT: My knee is getting better, the exercises are getting easier and it is easier to get in/out of car.   PERTINENT HISTORY: Lumbar fusion 2006 PAIN:  Are you having pain? Yes: NPRS scale: 2/10 Pain location: generalized muscle soreness  Pain description: soreness Aggravating factors: unsure  Relieving factors: unsure  Pain at worst 2/10 recently   PRECAUTIONS: None  WEIGHT BEARING RESTRICTIONS: No  FALLS:  Has patient fallen in last 6 months? Yes. Number of falls 1 that caused injury, (+)  FOF "I do take tumbles, had a really bad one a couple of years ago in [place]"   LIVING ENVIRONMENT: Lives with: lives with their spouse Lives in: House/apartment Stairs: 4 STE no rails but usually enters in basement and has full flight of steps to living room with rail Has following equipment at home: None  OCCUPATION: paralegal   PLOF: Independent, Independent with basic ADLs, Independent with gait, and  Independent with transfers  PATIENT GOALS: get motion back in my knee, be able to get up and down (especially from low commode), be able to exercise pain free   NEXT MD VISIT: Dr. Shon Baton July 16th   OBJECTIVE:   DIAGNOSTIC FINDINGS: 4 views of the right knee including AP standing, Rosenberg, lateral and  sunrise views were ordered and reviewed by myself.  X-rays demonstrate  moderate patellofemoral arthralgia, there is relatively well-preserved  tibiofemoral joint space without significant arthritic change.  There is  chondrocalcinosis noted throughout the lateral greater than medial joint  line.  No acute fracture noted.   Limited musculoskeletal ultrasound of the right lower extremity, right  knee was performed today.  Evaluation shows overlying quadricep tendon  without pathology.  There is a small to moderate effusion in the  suprapatellar pouch of the knee.  The medial joint line shows some  calcification, likely chondrocalcinosis over the medial joint.  No  cortical irregularity of the medial femoral condyle noted.  Patellar  tendon seen intact with proper attachment and insertion on the inferior  patella to the tibial tubercle without evidence of tearing.   Impression: Small to moderate joint effusion with chondrocalcinosis noted  within the joint   PATIENT SURVEYS:  FOTO 57, predicted 69 in 12 visits   COGNITION: Overall cognitive status: Within functional limits for tasks assessed     SENSATION: Not tested  EDEMA:  Visible edema noted  MUSCLE LENGTH: HS WNL B Piriformis WNL B    PALPATION: No areas TTP around R knee, no muscle spasms noted   LOWER EXTREMITY ROM:  Active ROM Right eval Left eval  Hip flexion    Hip extension    Hip abduction    Hip adduction    Hip internal rotation    Hip external rotation    Knee flexion 2* 1*  Knee extension 134* 132*  Ankle dorsiflexion    Ankle plantarflexion    Ankle inversion    Ankle eversion     (Blank  rows = not tested)  LOWER EXTREMITY MMT:  MMT Right eval Left eval Right 06/29/23 Left 06/29/23  Hip flexion 3 3 4 4   Hip extension 3 3 3 4   Hip abduction 3 3 4+ 4  Hip adduction      Hip internal rotation      Hip external rotation      Knee flexion 3+ 3+ 4- 4+  Knee extension 4 4+ 4+ 4+  Ankle dorsiflexion 5 5    Ankle plantarflexion      Ankle inversion      Ankle eversion       (Blank rows = not tested)       TODAY'S TREATMENT:  07/10/23 TherEx  Nustep L5 x6 minutes BLEs for warmup Step ups 6 inch step no UE support X 10 bilat Tandem balance 30 sec X 2 bilat without UE support SLS balance 15 sec X 2 bilat without UE support Leg press DL 16# 1W96, then single leg 25# 2X10 bilat Bridges hold 5 sec 2X10 Hip  flexor/quad stretch off edge of mat table 2x30 seconds B Sidelying hip ABD x10 B Seated HS stretches 2x30 seconds B   06/29/23  Objective measures, appropriate measures, further care planning  TherEx  Nustep L1 x6 minutes BLEs for warmup/active recovery   Staggered bridge x10 B Hip flexor/quad stretch off edge of mat table 2x30 seconds B Sidelying hip ABD x10 B Seated HS stretches 2x30 seconds B     06/27/23  TherEx  Bridges + ABD into green TB x10 Walking bridges x6 Quadruped knee ABD x5 B green TB Prone HS curls green TB x10 B Single leg STS from 28 inch mat table x10 B (alternating) Forward step ups 6 inch box x10 B focus on slow control  Elevated lunges onto 6 inch x10 B (alternating) Shuttle leg press 50# x10, 25# x10 B single leg                                                                                                                              06/18/23:  TherEx:  Nustep: level 5 x 5 minutes Calf stretch on slant board x 2 holding 30 sec Seated SLR: 2x 10  LAQ: 3# 2 x 10  Bridges: 2 x 10 holding 5 sec Sidelying hip abd 2 x 10  Supine hamstring stretches: x 3 holding 30 seconds     PATIENT EDUCATION:  Education details:  eval findings, HEP, POC Person educated: Patient Education method: Explanation, Demonstration, and Handouts Education comprehension: verbalized understanding, returned demonstration, and needs further education  HOME EXERCISE PROGRAM:  Access Code: [email or web address] URL: [email or web address] Date: 06/27/2023 Prepared by: Nedra Hai  Exercises - Supine Bridge with Resistance Band  - 2 x daily - 7 x weekly - 1 sets - 10 reps - 2 hold - Sidelying Hip Abduction with Resistance at Thighs  - 2 x daily - 7 x weekly - 1 sets - 10 reps - 1 hold - Standing Hamstring Curl with Resistance  - 2 x daily - 7 x weekly - 1 sets - 10 reps - 1 hold - Seated Hamstring Stretch  - 1-2 x daily - 7 x weekly - 3 reps - 30 seconds hold - Bridge Walk Out  - 1-2 x daily - 7 x weekly - 1 sets - 5-10 reps - Prone Hamstring Curl with Anchored Resistance  - 1-2 x daily - 7 x weekly - 1 sets - 10 reps - 1 second  hold - Quadruped Hip Abduction with Resistance Loop  - 1-2 x daily - 7 x weekly - 1 sets - 5-10 reps - 1 second  hold  ASSESSMENT:  CLINICAL IMPRESSION: Her knee was overall doing good today so we did progress slightly with her exercises and added in 2 balance exercises today. She had good tolerance to session overall. Continue PT plan.   OBJECTIVE IMPAIRMENTS: decreased strength, increased edema, and pain.   ACTIVITY LIMITATIONS: sitting, standing, squatting, sleeping, stairs, transfers, and locomotion  level  PARTICIPATION LIMITATIONS: driving, shopping, community activity, occupation, and yard work  PERSONAL FACTORS: Age, Behavior pattern, Fitness, Past/current experiences, Social background, and Time since onset of injury/illness/exacerbation are also affecting patient's functional outcome.   REHAB POTENTIAL: Excellent  CLINICAL DECISION MAKING: Stable/uncomplicated  EVALUATION COMPLEXITY: Low   GOALS: Goals reviewed with patient? Yes  SHORT TERM GOALS: Target date: 07/03/2023   Will  be compliant with appropriate progressive HEP  Baseline: Goal status: MET  06/29/23  2.  R knee pain to be no more than 1/10 at worst  Baseline:  Goal status:  ONGOING 06/29/23  3.  Will be compliant with appropriate edema management strategies (IE compression stocking, LE elevation, ice, etc) Baseline:  Goal status: ONGOING 7/12    LONG TERM GOALS: Target date: 07/24/2023    MMT to have improved by 1 grade in all weak groups  Baseline:  Goal status: INITIAL  2.  Will be able to ascend and descend stairs with no increase from resting pain R knee  Baseline:  Goal status: INITIAL  3.  Will be able to perform kneeling tasks and floor to stand with no increase from resting pain R knee  Baseline:  Goal status: INITIAL  4.  FOTO score to be within 5 points of goal  Baseline:  Goal status: INITIAL     PLAN:  PT FREQUENCY: 1-2x/week  PT DURATION: 6 weeks  PLANNED INTERVENTIONS: Therapeutic exercises, Therapeutic activity, Patient/Family education, Self Care, Aquatic Therapy, Dry Needling, Taping, Ionotophoresis 4mg /ml Dexamethasone, Manual therapy, and Re-evaluation  PLAN FOR NEXT SESSION: focus on functional strengthening and balance as appropriate.   Ivery Quale, PT, DPT 07/10/23 8:41 AM

## 2023-07-12 NOTE — Therapy (Signed)
OUTPATIENT PHYSICAL THERAPY LOWER EXTREMITY TREATMENT  Patient Name: Sonia Adams MRN: [id number] DOB:30-Dec-1955, 67 y.o., female Today's Date: 07/13/2023  END OF SESSION:  PT End of Session - 07/13/23 0800     Visit Number 6    Number of Visits 13    Date for PT Re-Evaluation 07/24/23    Authorization Type UHC MCR    Authorization Time Period 06/12/23 to 07/24/23    Progress Note Due on Visit 10    PT Start Time 0801    PT Stop Time 0844    PT Time Calculation (min) 43 min    Activity Tolerance Patient tolerated treatment well;No increased pain    Behavior During Therapy WFL for tasks assessed/performed                 Past Medical History:  Diagnosis Date   Abnormal pap    HSV-2 infection    Menopause    Past Surgical History:  Procedure Laterality Date   BASAL CELL CARCINOMA EXCISION     upper lip   COLONOSCOPY WITH PROPOFOL N/A 04/27/2022   Procedure: COLONOSCOPY WITH PROPOFOL;  Surgeon: Lemar Lofty., MD;  Location[facility] ENDOSCOPY;  Service: Gastroenterology;  Laterality: N/A;   ENDOSCOPIC MUCOSAL RESECTION N/A 04/27/2022   Procedure: ENDOSCOPIC MUCOSAL RESECTION;  Surgeon: Meridee Score Netty Starring., MD;  Location: [facility] ENDOSCOPY;  Service: Gastroenterology;  Laterality: N/A;   HEMOSTASIS CLIP PLACEMENT  04/27/2022   Procedure: HEMOSTASIS CLIP PLACEMENT;  Surgeon: Lemar Lofty., MD;  Location: [facility] ENDOSCOPY;  Service: Gastroenterology;;   LUMBAR FUSION  12/18/2004   POLYPECTOMY  04/27/2022   Procedure: POLYPECTOMY;  Surgeon: Lemar Lofty., MD;  Location: [facility] ENDOSCOPY;  Service: Gastroenterology;;   SUBMUCOSAL LIFTING INJECTION  04/27/2022   Procedure: SUBMUCOSAL LIFTING INJECTION;  Surgeon: Lemar Lofty., MD;  Location: [facility] ENDOSCOPY;  Service: Gastroenterology;;   TONSILLECTOMY  12/18/1960   TUBAL LIGATION  12/19/1987   Patient Active Problem List   Diagnosis Date Noted   Personal history of colonic polyps 07/08/2013    Nonspecific abnormal electrocardiogram (ECG) (EKG) 06/24/2013   Multinodular goiter 06/24/2013   Herniated cervical disc 06/24/2013   Former smoker  35 year  declines screening chest CT  cessation 2008 06/24/2013   Family history of breast cancer in first degree relative 06/24/2013   Abnormal EKG 06/24/2013   Asthmatic bronchitis 01/06/2013   Allergic rhinitis 08/01/2012   Dyspareunia 08/01/2012   Symptoms, such as flushing, sleeplessness, headache, lack of concentration, associated with the menopause 08/01/2012   Hyperlipidemia 08/01/2012   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/01/2012   Abnormal Pap smear of cervix 08/01/2012    PCP: Nadyne Coombes MD   REFERRING PROVIDER: Madelyn Brunner, DO  REFERRING DIAG:  Diagnosis  M25.561 (ICD-10-CM) - Acute pain of right knee    THERAPY DIAG:  Acute pain of right knee  Muscle weakness (generalized)  Localized edema  Rationale for Evaluation and Treatment: Rehabilitation  ONSET DATE: May 1st 2024  SUBJECTIVE:   SUBJECTIVE STATEMENT:  Pt states she is feeling really good this morning, no pain. Felt a good bit of soreness after last session but can tell she is improving. Doesn't really feel she's having as much swelling anymore.    PERTINENT HISTORY: Lumbar fusion 2006 PAIN:  Are you having pain? Yes: NPRS scale: 0/10 Pain location: generalized muscle soreness  Pain description: soreness Aggravating factors: unsure  Relieving factors: unsure  Pain at worst 2/10 recently   PRECAUTIONS: None  WEIGHT BEARING RESTRICTIONS:  No  FALLS:  Has patient fallen in last 6 months? Yes. Number of falls 1 that caused injury, (+) FOF "I do take tumbles, had a really bad one a couple of years ago in [place]"   LIVING ENVIRONMENT: Lives with: lives with their spouse Lives in: House/apartment Stairs: 4 STE no rails but usually enters in basement and has full flight of steps to living room with rail Has following equipment at home:  None  OCCUPATION: paralegal   PLOF: Independent, Independent with basic ADLs, Independent with gait, and Independent with transfers  PATIENT GOALS: get motion back in my knee, be able to get up and down (especially from low commode), be able to exercise pain free   NEXT MD VISIT: Dr. Shon Baton July 16th   OBJECTIVE: (objective measures completed at initial evaluation unless otherwise dated)   DIAGNOSTIC FINDINGS: 4 views of the right knee including AP standing, Rosenberg, lateral and  sunrise views were ordered and reviewed by myself.  X-rays demonstrate  moderate patellofemoral arthralgia, there is relatively well-preserved  tibiofemoral joint space without significant arthritic change.  There is  chondrocalcinosis noted throughout the lateral greater than medial joint  line.  No acute fracture noted.   Limited musculoskeletal ultrasound of the right lower extremity, right  knee was performed today.  Evaluation shows overlying quadricep tendon  without pathology.  There is a small to moderate effusion in the  suprapatellar pouch of the knee.  The medial joint line shows some  calcification, likely chondrocalcinosis over the medial joint.  No  cortical irregularity of the medial femoral condyle noted.  Patellar  tendon seen intact with proper attachment and insertion on the inferior  patella to the tibial tubercle without evidence of tearing.   Impression: Small to moderate joint effusion with chondrocalcinosis noted  within the joint   PATIENT SURVEYS:  FOTO 57, predicted 69 in 12 visits  07/13/23 FOTO 64   COGNITION: Overall cognitive status: Within functional limits for tasks assessed     SENSATION: Not tested  EDEMA:  Visible edema noted  MUSCLE LENGTH: HS WNL B Piriformis WNL B    PALPATION: No areas TTP around R knee, no muscle spasms noted   LOWER EXTREMITY ROM:  Active ROM Right eval Left eval  Hip flexion    Hip extension    Hip abduction    Hip  adduction    Hip internal rotation    Hip external rotation    Knee flexion 2* 1*  Knee extension 134* 132*  Ankle dorsiflexion    Ankle plantarflexion    Ankle inversion    Ankle eversion     (Blank rows = not tested)  LOWER EXTREMITY MMT:  MMT Right eval Left eval Right 06/29/23 Left 06/29/23  Hip flexion 3 3 4 4   Hip extension 3 3 3 4   Hip abduction 3 3 4+ 4  Hip adduction      Hip internal rotation      Hip external rotation      Knee flexion 3+ 3+ 4- 4+  Knee extension 4 4+ 4+ 4+  Ankle dorsiflexion 5 5    Ankle plantarflexion      Ankle inversion      Ankle eversion       (Blank rows = not tested)       TODAY'S TREATMENT:  [facility] Adult PT Treatment:  DATE: 07/13/23 Therapeutic Exercise: Nu step 5 min during subjective LE only  Sidelying abduction green band around knees, x12 BIL Green band bridge 2x12  6 inch step ups x12 in // bars BIL LE cues for pacing 4 inch lateral step ups x8 BIL LE cues for mechanics Tandem stance x83min BIL  Tandem stance airex 1x BIL to fatigue in // bars, CGA    07/10/23 TherEx  Nustep L5 x6 minutes BLEs for warmup Step ups 6 inch step no UE support X 10 bilat Tandem balance 30 sec X 2 bilat without UE support SLS balance 15 sec X 2 bilat without UE support Leg press DL 16# 1W96, then single leg 25# 2X10 bilat Bridges hold 5 sec 2X10 Hip flexor/quad stretch off edge of mat table 2x30 seconds B Sidelying hip ABD x10 B Seated HS stretches 2x30 seconds B   06/29/23  Objective measures, appropriate measures, further care planning  TherEx  Nustep L1 x6 minutes BLEs for warmup/active recovery   Staggered bridge x10 B Hip flexor/quad stretch off edge of mat table 2x30 seconds B Sidelying hip ABD x10 B Seated HS stretches 2x30 seconds B     06/27/23  TherEx  Bridges + ABD into green TB x10 Walking bridges x6 Quadruped knee ABD x5 B green TB Prone HS curls green TB x10  B Single leg STS from 28 inch mat table x10 B (alternating) Forward step ups 6 inch box x10 B focus on slow control  Elevated lunges onto 6 inch x10 B (alternating) Shuttle leg press 50# x10, 25# x10 B single leg                                                                                                                              06/18/23:  TherEx:  Nustep: level 5 x 5 minutes Calf stretch on slant board x 2 holding 30 sec Seated SLR: 2x 10  LAQ: 3# 2 x 10  Bridges: 2 x 10 holding 5 sec Sidelying hip abd 2 x 10  Supine hamstring stretches: x 3 holding 30 seconds     PATIENT EDUCATION:  Education details: rationale for interventions, HEP  Person educated: Patient Education method: Programmer, multimedia, Demonstration, and Handouts Education comprehension: verbalized understanding, returned demonstration, and needs further education  HOME EXERCISE PROGRAM:  Access Code: [email or web address] URL: [email or web address] Date: 06/27/2023 Prepared by: Nedra Hai  Exercises - Supine Bridge with Resistance Band  - 2 x daily - 7 x weekly - 1 sets - 10 reps - 2 hold - Sidelying Hip Abduction with Resistance at Thighs  - 2 x daily - 7 x weekly - 1 sets - 10 reps - 1 hold - Standing Hamstring Curl with Resistance  - 2 x daily - 7 x weekly - 1 sets - 10 reps - 1 hold - Seated Hamstring Stretch  - 1-2 x daily - 7 x weekly - 3 reps - 30 seconds hold - [employer]  -  1-2 x daily - 7 x weekly - 1 sets - 5-10 reps - Prone Hamstring Curl with Anchored Resistance  - 1-2 x daily - 7 x weekly - 1 sets - 10 reps - 1 second  hold - Quadruped Hip Abduction with Resistance Loop  - 1-2 x daily - 7 x weekly - 1 sets - 5-10 reps - 1 second  hold  ASSESSMENT:  CLINICAL IMPRESSION:  Pt arrives w/o pain, continues to progress well overall. Progressing program for increased volume today and increased balance work which she tolerates well, cues as above. No adverse events, reports fatigue but no  increase in pain on departure. Recommend continuing along current POC in order to address relevant deficits and improve functional tolerance. Pt departs today's session in no acute distress, all voiced questions/concerns addressed appropriately from PT perspective.     OBJECTIVE IMPAIRMENTS: decreased strength, increased edema, and pain.   ACTIVITY LIMITATIONS: sitting, standing, squatting, sleeping, stairs, transfers, and locomotion level  PARTICIPATION LIMITATIONS: driving, shopping, community activity, occupation, and yard work  PERSONAL FACTORS: Age, Behavior pattern, Fitness, Past/current experiences, Social background, and Time since onset of injury/illness/exacerbation are also affecting patient's functional outcome.   REHAB POTENTIAL: Excellent  CLINICAL DECISION MAKING: Stable/uncomplicated  EVALUATION COMPLEXITY: Low   GOALS: Goals reviewed with patient? Yes  SHORT TERM GOALS: Target date: 07/03/2023   Will be compliant with appropriate progressive HEP  Baseline: Goal status: MET  06/29/23  2.  R knee pain to be no more than 1/10 at worst  Baseline:  Goal status:  ONGOING 06/29/23  3.  Will be compliant with appropriate edema management strategies (IE compression stocking, LE elevation, ice, etc) Baseline:  Goal status: ONGOING 7/12    LONG TERM GOALS: Target date: 07/24/2023    MMT to have improved by 1 grade in all weak groups  Baseline:  Goal status: INITIAL  2.  Will be able to ascend and descend stairs with no increase from resting pain R knee  Baseline:  Goal status: INITIAL  3.  Will be able to perform kneeling tasks and floor to stand with no increase from resting pain R knee  Baseline:  Goal status: INITIAL  4.  FOTO score to be within 5 points of goal  Baseline:  07/13/23: 64 (69 predicted) Goal status: MET     PLAN:  PT FREQUENCY: 1-2x/week  PT DURATION: 6 weeks  PLANNED INTERVENTIONS: Therapeutic exercises, Therapeutic activity,  Patient/Family education, Self Care, Aquatic Therapy, Dry Needling, Taping, Ionotophoresis 4mg /ml Dexamethasone, Manual therapy, and Re-evaluation  PLAN FOR NEXT SESSION: focus on functional strengthening and balance as appropriate.   Ashley Murrain PT, DPT 07/13/2023 8:47 AM

## 2023-07-13 ENCOUNTER — Ambulatory Visit: Payer: Medicare Other | Admitting: Physical Therapy

## 2023-07-13 ENCOUNTER — Encounter: Payer: Self-pay | Admitting: Physical Therapy

## 2023-07-13 DIAGNOSIS — M25561 Pain in right knee: Secondary | ICD-10-CM

## 2023-07-13 DIAGNOSIS — M6281 Muscle weakness (generalized): Secondary | ICD-10-CM

## 2023-07-13 DIAGNOSIS — R600 Localized edema: Secondary | ICD-10-CM | POA: Diagnosis not present

## 2023-07-13 NOTE — Therapy (Signed)
OUTPATIENT PHYSICAL THERAPY LOWER EXTREMITY TREATMENT  Patient Name: LAYLAA KNECHTEL MRN: [id number] DOB:[birth year], 67 y.o., female Today's Date: 07/16/2023  END OF SESSION:  PT End of Session - 07/16/23 0803     Visit Number 7    Number of Visits 13    Date for PT Re-Evaluation 07/24/23    Authorization Type [employer] MCR    Authorization Time Period 06/12/23 to 07/24/23    Progress Note Due on Visit 10    PT Start Time 0804    Activity Tolerance Patient tolerated treatment well;No increased pain    Behavior During Therapy WFL for tasks assessed/performed                  Past Medical History:  Diagnosis Date   Abnormal pap    HSV-2 infection    Menopause    Past Surgical History:  Procedure Laterality Date   BASAL CELL CARCINOMA EXCISION     upper lip   COLONOSCOPY WITH PROPOFOL N/A 04/27/2022   Procedure: COLONOSCOPY WITH PROPOFOL;  Surgeon: Lemar Lofty., MD;  Location: [facility] ENDOSCOPY;  Service: Gastroenterology;  Laterality: N/A;   ENDOSCOPIC MUCOSAL RESECTION N/A 04/27/2022   Procedure: ENDOSCOPIC MUCOSAL RESECTION;  Surgeon: Meridee Score Netty Starring., MD;  Location: [facility] ENDOSCOPY;  Service: Gastroenterology;  Laterality: N/A;   HEMOSTASIS CLIP PLACEMENT  04/27/2022   Procedure: HEMOSTASIS CLIP PLACEMENT;  Surgeon: Lemar Lofty., MD;  Location: [facility] ENDOSCOPY;  Service: Gastroenterology;;   LUMBAR FUSION  12/18/2004   POLYPECTOMY  04/27/2022   Procedure: POLYPECTOMY;  Surgeon: Lemar Lofty., MD;  Location: [facility] ENDOSCOPY;  Service: Gastroenterology;;   SUBMUCOSAL LIFTING INJECTION  04/27/2022   Procedure: SUBMUCOSAL LIFTING INJECTION;  Surgeon: Lemar Lofty., MD;  Location: [facility] ENDOSCOPY;  Service: Gastroenterology;;   TONSILLECTOMY  12/18/1960   TUBAL LIGATION  12/19/1987   Patient Active Problem List   Diagnosis Date Noted   Personal history of colonic polyps 07/08/2013   Nonspecific abnormal electrocardiogram (ECG) (EKG)  06/24/2013   Multinodular goiter 06/24/2013   Herniated cervical disc 06/24/2013   Former smoker  35 year  declines screening chest CT  cessation 2008 06/24/2013   Family history of breast cancer in first degree relative 06/24/2013   Abnormal EKG 06/24/2013   Asthmatic bronchitis 01/06/2013   Allergic rhinitis 08/01/2012   Dyspareunia 08/01/2012   Symptoms, such as flushing, sleeplessness, headache, lack of concentration, associated with the menopause 08/01/2012   Hyperlipidemia 08/01/2012   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/01/2012   Abnormal Pap smear of cervix 08/01/2012    PCP: Nadyne Coombes MD   REFERRING PROVIDER: Madelyn Brunner, DO  REFERRING DIAG:  Diagnosis  M25.561 (ICD-10-CM) - Acute pain of right knee    THERAPY DIAG:  Acute pain of right knee  Muscle weakness (generalized)  Localized edema  Rationale for Evaluation and Treatment: Rehabilitation  ONSET DATE: May 1st 2024  SUBJECTIVE:   SUBJECTIVE STATEMENT:  Mild soreness today she attributes to weekend activities but no overt pain, states she felt good after last session and has been practicing balance. No other new updates, sees Dr. Shon Baton later this morning   PERTINENT HISTORY: Lumbar fusion 2006 PAIN:  Are you having pain? Yes: NPRS scale: 0/10 Pain location: generalized muscle soreness  Pain description: soreness Aggravating factors: unsure  Relieving factors: unsure  Pain at worst 2/10 recently   PRECAUTIONS: None  WEIGHT BEARING RESTRICTIONS: No  FALLS:  Has patient fallen in last 6 months? Yes. Number of falls 1 that  caused injury, (+) FOF "I do take tumbles, had a really bad one a couple of years ago in [place]"   LIVING ENVIRONMENT: Lives with: lives with their spouse Lives in: House/apartment Stairs: 4 STE no rails but usually enters in basement and has full flight of steps to living room with rail Has following equipment at home: None  OCCUPATION: paralegal   PLOF: Independent,  Independent with basic ADLs, Independent with gait, and Independent with transfers  PATIENT GOALS: get motion back in my knee, be able to get up and down (especially from low commode), be able to exercise pain free   NEXT MD VISIT: Dr. Shon Baton July 16th   OBJECTIVE: (objective measures completed at initial evaluation unless otherwise dated)   DIAGNOSTIC FINDINGS: 4 views of the right knee including AP standing, Rosenberg, lateral and  sunrise views were ordered and reviewed by myself.  X-rays demonstrate  moderate patellofemoral arthralgia, there is relatively well-preserved  tibiofemoral joint space without significant arthritic change.  There is  chondrocalcinosis noted throughout the lateral greater than medial joint  line.  No acute fracture noted.   Limited musculoskeletal ultrasound of the right lower extremity, right  knee was performed today.  Evaluation shows overlying quadricep tendon  without pathology.  There is a small to moderate effusion in the  suprapatellar pouch of the knee.  The medial joint line shows some  calcification, likely chondrocalcinosis over the medial joint.  No  cortical irregularity of the medial femoral condyle noted.  Patellar  tendon seen intact with proper attachment and insertion on the inferior  patella to the tibial tubercle without evidence of tearing.   Impression: Small to moderate joint effusion with chondrocalcinosis noted  within the joint   PATIENT SURVEYS:  FOTO 57, predicted 69 in 12 visits  07/13/23 FOTO 64   COGNITION: Overall cognitive status: Within functional limits for tasks assessed     SENSATION: Not tested  EDEMA:  Visible edema noted  MUSCLE LENGTH: HS WNL B Piriformis WNL B    PALPATION: No areas TTP around R knee, no muscle spasms noted   LOWER EXTREMITY ROM:  Active ROM Right eval Left eval  Hip flexion    Hip extension    Hip abduction    Hip adduction    Hip internal rotation    Hip external  rotation    Knee flexion 2* 1*  Knee extension 134* 132*  Ankle dorsiflexion    Ankle plantarflexion    Ankle inversion    Ankle eversion     (Blank rows = not tested)  LOWER EXTREMITY MMT:  MMT Right eval Left eval Right 06/29/23 Left 06/29/23 Right/Left 07/16/23   Hip flexion 3 3 4 4  4/4  Hip extension 3 3 3 4    Hip abduction 3 3 4+ 4   Hip adduction       Hip internal rotation       Hip external rotation       Knee flexion 3+ 3+ 4- 4+ 5/5  Knee extension 4 4+ 4+ 4+ 5/5  Ankle dorsiflexion 5 5     Ankle plantarflexion       Ankle inversion       Ankle eversion        (Blank rows = not tested)       TODAY'S TREATMENT:  [facility] Adult PT Treatment:  DATE: 07/16/23 Therapeutic Exercise: Nu step 5 min L4 LE only during subjective 8 inch fwd step up in // bars x10 BIL no UE support 4 inch lateral step up in // bars x12 BIL no UE support Runner's step up onto airex in // bars x8 BIL no UE support CGA for safety 2 laps tandem walking in // bars CGA first lap  STS x12 BW (cues for BOS), 5# 2x8 Standing marches w/ green band x8 BIL     [facility] Adult PT Treatment:                                                DATE: 07/13/23 Therapeutic Exercise: Nu step 5 min during subjective LE only  Sidelying abduction green band around knees, x12 BIL Green band bridge 2x12  6 inch step ups x12 in // bars BIL LE cues for pacing 4 inch lateral step ups x8 BIL LE cues for mechanics Tandem stance x21min BIL  Tandem stance airex 1x BIL to fatigue in // bars, CGA    07/10/23 TherEx  Nustep L5 x6 minutes BLEs for warmup Step ups 6 inch step no UE support X 10 bilat Tandem balance 30 sec X 2 bilat without UE support SLS balance 15 sec X 2 bilat without UE support Leg press DL 16# 1W96, then single leg 25# 2X10 bilat Bridges hold 5 sec 2X10 Hip flexor/quad stretch off edge of mat table 2x30 seconds B Sidelying hip ABD x10 B Seated HS stretches  2x30 seconds B   06/29/23  Objective measures, appropriate measures, further care planning  TherEx  Nustep L1 x6 minutes BLEs for warmup/active recovery   Staggered bridge x10 B Hip flexor/quad stretch off edge of mat table 2x30 seconds B Sidelying hip ABD x10 B Seated HS stretches 2x30 seconds B     06/27/23  TherEx  Bridges + ABD into green TB x10 Walking bridges x6 Quadruped knee ABD x5 B green TB Prone HS curls green TB x10 B Single leg STS from 28 inch mat table x10 B (alternating) Forward step ups 6 inch box x10 B focus on slow control  Elevated lunges onto 6 inch x10 B (alternating) Shuttle leg press 50# x10, 25# x10 B single leg                                                                                                                              06/18/23:  TherEx:  Nustep: level 5 x 5 minutes Calf stretch on slant board x 2 holding 30 sec Seated SLR: 2x 10  LAQ: 3# 2 x 10  Bridges: 2 x 10 holding 5 sec Sidelying hip abd 2 x 10  Supine hamstring stretches: x 3 holding 30 seconds     PATIENT EDUCATION:  Education details: rationale for interventions, HEP  Person educated: Patient Education method: Explanation, Demonstration, and Handouts Education comprehension: verbalized understanding, returned demonstration, and needs further education  HOME EXERCISE PROGRAM:  Access Code: [email or web address] URL: [email or web address] Date: 06/27/2023 Prepared by: Nedra Hai  Exercises - Supine Bridge with Resistance Band  - 2 x daily - 7 x weekly - 1 sets - 10 reps - 2 hold - Sidelying Hip Abduction with Resistance at Thighs  - 2 x daily - 7 x weekly - 1 sets - 10 reps - 1 hold - Standing Hamstring Curl with Resistance  - 2 x daily - 7 x weekly - 1 sets - 10 reps - 1 hold - Seated Hamstring Stretch  - 1-2 x daily - 7 x weekly - 3 reps - 30 seconds hold - Bridge Walk Out  - 1-2 x daily - 7 x weekly - 1 sets - 5-10 reps - Prone Hamstring Curl with  Anchored Resistance  - 1-2 x daily - 7 x weekly - 1 sets - 10 reps - 1 second  hold - Quadruped Hip Abduction with Resistance Loop  - 1-2 x daily - 7 x weekly - 1 sets - 5-10 reps - 1 second  hold  ASSESSMENT:  CLINICAL IMPRESSION:  Arrives with mild soreness, no overt pain. Continues to progress well overall, able to increase challenge with balance exercises and strengthening today, cues as above. Also progressed for addition of resisted hip flexion strengthening. No adverse events, reports improved soreness and no knee pain on departure (does endorses some R wrist pain that has been present over weekend, denies worsening from activities during today's session, pt is encouraged to monitor and modify activities accordingly). Recommend continuing along current POC in order to address relevant deficits and improve functional tolerance. Pt departs today's session in no acute distress, all voiced questions/concerns addressed appropriately from PT perspective.    OBJECTIVE IMPAIRMENTS: decreased strength, increased edema, and pain.   ACTIVITY LIMITATIONS: sitting, standing, squatting, sleeping, stairs, transfers, and locomotion level  PARTICIPATION LIMITATIONS: driving, shopping, community activity, occupation, and yard work  PERSONAL FACTORS: Age, Behavior pattern, Fitness, Past/current experiences, Social background, and Time since onset of injury/illness/exacerbation are also affecting patient's functional outcome.   REHAB POTENTIAL: Excellent  CLINICAL DECISION MAKING: Stable/uncomplicated  EVALUATION COMPLEXITY: Low   GOALS: Goals reviewed with patient? Yes  SHORT TERM GOALS: Target date: 07/03/2023   Will be compliant with appropriate progressive HEP  Baseline: Goal status: MET  06/29/23  2.  R knee pain to be no more than 1/10 at worst  Baseline:  Goal status:  ONGOING 06/29/23  3.  Will be compliant with appropriate edema management strategies (IE compression stocking, LE  elevation, ice, etc) Baseline:  Goal status: ONGOING 7/12    LONG TERM GOALS: Target date: 07/24/2023    MMT to have improved by 1 grade in all weak groups  Baseline:  Goal status: INITIAL  2.  Will be able to ascend and descend stairs with no increase from resting pain R knee  Baseline:  Goal status: INITIAL  3.  Will be able to perform kneeling tasks and floor to stand with no increase from resting pain R knee  Baseline:  Goal status: INITIAL  4.  FOTO score to be within 5 points of goal  Baseline:  07/13/23: 64 (69 predicted) Goal status: MET     PLAN:  PT FREQUENCY: 1-2x/week  PT DURATION: 6 weeks  PLANNED INTERVENTIONS: Therapeutic exercises, Therapeutic activity, Patient/Family education, Self Care, Aquatic Therapy, Dry Needling, Taping,  Ionotophoresis 4mg /ml Dexamethasone, Manual therapy, and Re-evaluation  PLAN FOR NEXT SESSION: focus on functional strengthening and balance as appropriate.   Ashley Murrain PT, DPT 07/16/2023 8:04 AM

## 2023-07-16 ENCOUNTER — Encounter: Payer: Self-pay | Admitting: Physical Therapy

## 2023-07-16 ENCOUNTER — Encounter: Payer: Self-pay | Admitting: Sports Medicine

## 2023-07-16 ENCOUNTER — Ambulatory Visit (INDEPENDENT_AMBULATORY_CARE_PROVIDER_SITE_OTHER): Payer: Medicare Other | Admitting: Sports Medicine

## 2023-07-16 ENCOUNTER — Ambulatory Visit: Payer: Medicare Other | Admitting: Physical Therapy

## 2023-07-16 DIAGNOSIS — M6281 Muscle weakness (generalized): Secondary | ICD-10-CM

## 2023-07-16 DIAGNOSIS — R600 Localized edema: Secondary | ICD-10-CM | POA: Diagnosis not present

## 2023-07-16 DIAGNOSIS — M25561 Pain in right knee: Secondary | ICD-10-CM

## 2023-07-16 DIAGNOSIS — M11261 Other chondrocalcinosis, right knee: Secondary | ICD-10-CM

## 2023-07-16 NOTE — Progress Notes (Signed)
Sonia Adams - 67 y.o. female MRN [id number]  Date of birth: [birth year]  Office Visit Note: Visit Date: 07/16/2023 PCP: Dois Davenport, MD Referred by: Dois Davenport, MD  Subjective: Chief Complaint  Patient presents with   Right Knee - Follow-up   HPI: Sonia Adams is a pleasant 67 y.o. female who presents today for follow-up of right knee pain.  Sonia Adams is doing well - certainly finding good improvement with her therapy.  She has done formalized physical therapy, has had about 6 sessions thus far, finding good improvement.  Working on muscle imbalance as well.  She has noticed improvement in range of motion and strength.  Having less pain with daily activities such as getting in and out of the car, going up onto her bed.  Has noticed an improvement in her swelling as well.  Really not having much pain at this point.  She is taking Advil only as needed.  Pertinent ROS were reviewed with the patient and found to be negative unless otherwise specified above in HPI.   Assessment & Plan: Visit Diagnoses:  1. Acute pain of right knee   2. Chondrocalcinosis of right knee    Plan: Sonia Adams has made good improvement with her right knee pain. This seems to be more of a prior exacerbation of her chondrocalcinosis.  She is doing both formalized physical therapy and her home rehab exercises, I would like her to continue this.  She has improvement in her knee effusion with only trace swelling today.  She may continue with Advil or Aleve only as needed.  She we will continue her PT and hip, I would like to see her at the end of this to reevauate the knee and discuss treatment and management going forward.  She may call or return sooner if any issues arise.  Follow-up: Return for follow-up for right knee at the end of physical therapy.   Meds & Orders: No orders of the defined types were placed in this encounter.  No orders of the defined types were placed in this encounter.     Procedures: No procedures performed      Clinical History: No specialty comments available.  She reports that she quit smoking about 16 years ago. Her smoking use included cigarettes. She started smoking about 36 years ago. She has a 20 pack-year smoking history. She has never used smokeless tobacco. No results for input(s): "HGBA1C", "LABURIC" in the last 8760 hours.  Objective:   Vital Signs: LMP 01/01/2013   Physical Exam  Gen: Well-appearing, in no acute distress; non-toxic CV: Well-perfused. Warm.  Resp: Breathing unlabored on room air; no wheezing. Psych: Fluid speech in conversation; appropriate affect; normal thought process Neuro: Sensation intact throughout. No gross coordination deficits.   Ortho Exam - Right knee: No significant medial or lateral joint line tenderness.  There is a trace effusion on the knee but improved from previous visits.  Range of motion is preserved from 0-135 degrees.  Negative varus or valgus instability.  5/5 strength with knee flexion and extension.  Imaging: No results found.  Past Medical/Family/Surgical/Social History: Medications & Allergies reviewed per EMR, new medications updated. Patient Active Problem List   Diagnosis Date Noted   Personal history of colonic polyps 07/08/2013   Nonspecific abnormal electrocardiogram (ECG) (EKG) 06/24/2013   Multinodular goiter 06/24/2013   Herniated cervical disc 06/24/2013   Former smoker  35 year  declines screening chest CT  cessation 2008 06/24/2013   Family history  of breast cancer in first degree relative 06/24/2013   Abnormal EKG 06/24/2013   Asthmatic bronchitis 01/06/2013   Allergic rhinitis 08/01/2012   Dyspareunia 08/01/2012   Symptoms, such as flushing, sleeplessness, headache, lack of concentration, associated with the menopause 08/01/2012   Hyperlipidemia 08/01/2012   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/01/2012   Abnormal Pap smear of cervix 08/01/2012   Past Medical History:   Diagnosis Date   Abnormal pap    HSV-2 infection    Menopause    Family History  Problem Relation Age of Onset   Diabetes Mother    Alzheimer's disease Mother    Stroke Father    Heart disease Father    Colon cancer Neg Hx    Colon polyps Neg Hx    Esophageal cancer Neg Hx    Stomach cancer Neg Hx    Rectal cancer Neg Hx    Past Surgical History:  Procedure Laterality Date   BASAL CELL CARCINOMA EXCISION     upper lip   COLONOSCOPY WITH PROPOFOL N/A 04/27/2022   Procedure: COLONOSCOPY WITH PROPOFOL;  Surgeon: Lemar Lofty., MD;  Location: [facility] ENDOSCOPY;  Service: Gastroenterology;  Laterality: N/A;   ENDOSCOPIC MUCOSAL RESECTION N/A 04/27/2022   Procedure: ENDOSCOPIC MUCOSAL RESECTION;  Surgeon: Meridee Score Netty Starring., MD;  Location: [facility] ENDOSCOPY;  Service: Gastroenterology;  Laterality: N/A;   HEMOSTASIS CLIP PLACEMENT  04/27/2022   Procedure: HEMOSTASIS CLIP PLACEMENT;  Surgeon: Lemar Lofty., MD;  Location: [facility] ENDOSCOPY;  Service: Gastroenterology;;   LUMBAR FUSION  12/18/2004   POLYPECTOMY  04/27/2022   Procedure: POLYPECTOMY;  Surgeon: Lemar Lofty., MD;  Location: [facility] ENDOSCOPY;  Service: Gastroenterology;;   SUBMUCOSAL LIFTING INJECTION  04/27/2022   Procedure: SUBMUCOSAL LIFTING INJECTION;  Surgeon: Lemar Lofty., MD;  Location: [facility] ENDOSCOPY;  Service: Gastroenterology;;   TONSILLECTOMY  12/18/1960   TUBAL LIGATION  12/19/1987   Social History   Occupational History   Not on file  Tobacco Use   Smoking status: Former    Current packs/day: 0.00    Average packs/day: 1 pack/day for 20.0 years (20.0 ttl pk-yrs)    Types: Cigarettes    Start date: 06/01/1987    Quit date: 06/01/2007    Years since quitting: 16.1   Smokeless tobacco: Never  Substance and Sexual Activity   Alcohol use: Yes    Comment: once a month   Drug use: No   Sexual activity: Yes    Partners: Male    Birth control/protection: Post-menopausal

## 2023-07-16 NOTE — Progress Notes (Signed)
Doing a lot better States she is doing PT 2x/week  And HEP days where she isnt in therapy Swelling has gone down

## 2023-07-19 ENCOUNTER — Encounter: Payer: Self-pay | Admitting: Physical Therapy

## 2023-07-19 ENCOUNTER — Ambulatory Visit: Payer: Medicare Other | Admitting: Physical Therapy

## 2023-07-19 DIAGNOSIS — M25561 Pain in right knee: Secondary | ICD-10-CM

## 2023-07-19 DIAGNOSIS — R600 Localized edema: Secondary | ICD-10-CM | POA: Diagnosis not present

## 2023-07-19 DIAGNOSIS — M6281 Muscle weakness (generalized): Secondary | ICD-10-CM | POA: Diagnosis not present

## 2023-07-19 NOTE — Therapy (Signed)
OUTPATIENT PHYSICAL THERAPY LOWER EXTREMITY TREATMENT  Patient Name: Sonia Adams MRN: [id number] DOB:Dec 16, 1956, 67 y.o., female Today's Date: 07/19/2023  END OF SESSION:  PT End of Session - 07/19/23 0807     Visit Number 8    Number of Visits 13    Date for PT Re-Evaluation 07/24/23    Authorization Type U[employer] MCR    Authorization Time Period 06/12/23 to 07/24/23    Progress Note Due on Visit 10    PT Start Time 0804    PT Stop Time 0845    PT Time Calculation (min) 41 min    Activity Tolerance Patient tolerated treatment well;No increased pain    Behavior During Therapy WFL for tasks assessed/performed                   Past Medical History:  Diagnosis Date   Abnormal pap    HSV-2 infection    Menopause    Past Surgical History:  Procedure Laterality Date   BASAL CELL CARCINOMA EXCISION     upper lip   COLONOSCOPY WITH PROPOFOL N/A 04/27/2022   Procedure: COLONOSCOPY WITH PROPOFOL;  Surgeon: Lemar Lofty., MD;  Location: [facility] ENDOSCOPY;  Service: Gastroenterology;  Laterality: N/A;   ENDOSCOPIC MUCOSAL RESECTION N/A 04/27/2022   Procedure: ENDOSCOPIC MUCOSAL RESECTION;  Surgeon: Meridee Score Netty Starring., MD;  Location: [facility] ENDOSCOPY;  Service: Gastroenterology;  Laterality: N/A;   HEMOSTASIS CLIP PLACEMENT  04/27/2022   Procedure: HEMOSTASIS CLIP PLACEMENT;  Surgeon: Lemar Lofty., MD;  Location: [facility] ENDOSCOPY;  Service: Gastroenterology;;   LUMBAR FUSION  12/18/2004   POLYPECTOMY  04/27/2022   Procedure: POLYPECTOMY;  Surgeon: Lemar Lofty., MD;  Location: [facility] ENDOSCOPY;  Service: Gastroenterology;;   SUBMUCOSAL LIFTING INJECTION  04/27/2022   Procedure: SUBMUCOSAL LIFTING INJECTION;  Surgeon: Lemar Lofty., MD;  Location: [facility] ENDOSCOPY;  Service: Gastroenterology;;   TONSILLECTOMY  12/18/1960   TUBAL LIGATION  12/19/1987   Patient Active Problem List   Diagnosis Date Noted   Personal history of colonic polyps 07/08/2013    Nonspecific abnormal electrocardiogram (ECG) (EKG) 06/24/2013   Multinodular goiter 06/24/2013   Herniated cervical disc 06/24/2013   Former smoker  35 year  declines screening chest CT  cessation 2008 06/24/2013   Family history of breast cancer in first degree relative 06/24/2013   Abnormal EKG 06/24/2013   Asthmatic bronchitis 01/06/2013   Allergic rhinitis 08/01/2012   Dyspareunia 08/01/2012   Symptoms, such as flushing, sleeplessness, headache, lack of concentration, associated with the menopause 08/01/2012   Hyperlipidemia 08/01/2012   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/01/2012   Abnormal Pap smear of cervix 08/01/2012    PCP: Nadyne Coombes MD   REFERRING PROVIDER: Madelyn Brunner, DO  REFERRING DIAG:  Diagnosis  M25.561 (ICD-10-CM) - Acute pain of right knee    THERAPY DIAG:  Acute pain of right knee  Muscle weakness (generalized)  Localized edema  Rationale for Evaluation and Treatment: Rehabilitation  ONSET DATE: May 1st 2024  SUBJECTIVE:   SUBJECTIVE STATEMENT:  Having a good morning; pain is very minimal   PERTINENT HISTORY: Lumbar fusion 2006 PAIN:  Are you having pain? Yes: NPRS scale: 0/10 Pain location: generalized muscle soreness  Pain description: soreness Aggravating factors: unsure  Relieving factors: unsure  Pain at worst 2/10 recently   PRECAUTIONS: None  WEIGHT BEARING RESTRICTIONS: No  FALLS:  Has patient fallen in last 6 months? Yes. Number of falls 1 that caused injury, (+) FOF "I do take tumbles, had  a really bad one a couple of years ago in [place]"   LIVING ENVIRONMENT: Lives with: lives with their spouse Lives in: House/apartment Stairs: 4 STE no rails but usually enters in basement and has full flight of steps to living room with rail Has following equipment at home: None  OCCUPATION: paralegal   PLOF: Independent, Independent with basic ADLs, Independent with gait, and Independent with transfers  PATIENT GOALS: get motion  back in my knee, be able to get up and down (especially from low commode), be able to exercise pain free    OBJECTIVE: (objective measures completed at initial evaluation unless otherwise dated)   DIAGNOSTIC FINDINGS: 4 views of the right knee including AP standing, Rosenberg, lateral and  sunrise views were ordered and reviewed by myself.  X-rays demonstrate  moderate patellofemoral arthralgia, there is relatively well-preserved  tibiofemoral joint space without significant arthritic change.  There is  chondrocalcinosis noted throughout the lateral greater than medial joint  line.  No acute fracture noted.   Limited musculoskeletal ultrasound of the right lower extremity, right  knee was performed today.  Evaluation shows overlying quadricep tendon  without pathology.  There is a small to moderate effusion in the  suprapatellar pouch of the knee.  The medial joint line shows some  calcification, likely chondrocalcinosis over the medial joint.  No  cortical irregularity of the medial femoral condyle noted.  Patellar  tendon seen intact with proper attachment and insertion on the inferior  patella to the tibial tubercle without evidence of tearing.   Impression: Small to moderate joint effusion with chondrocalcinosis noted  within the joint   PATIENT SURVEYS:  EVAL: FOTO 57, predicted 69 in 12 visits  07/13/23 FOTO 64   COGNITION: Overall cognitive status: Within functional limits for tasks assessed     SENSATION: Not tested  EDEMA:  Visible edema noted  MUSCLE LENGTH: HS WNL B Piriformis WNL B    PALPATION: No areas TTP around R knee, no muscle spasms noted   LOWER EXTREMITY ROM:  Active ROM Right eval Left eval  Knee flexion 2* 1*  Knee extension 134* 132*   (Blank rows = not tested)  LOWER EXTREMITY MMT:  MMT Right eval Left eval Right 06/29/23 Left 06/29/23 Right/Left 07/16/23   Hip flexion 3 3 4 4  4/4  Hip extension 3 3 3 4    Hip abduction 3 3 4+ 4    Knee flexion 3+ 3+ 4- 4+ 5/5  Knee extension 4 4+ 4+ 4+ 5/5  Ankle dorsiflexion 5 5      (Blank rows = not tested)       TODAY'S TREATMENT:  07/19/23 Therapeutic Exercise: SciFit L4 x 5 min L4 LE only during subjective Forward step ups onto 6" step x 10 reps bil Lateral step ups onto 6" step x 10 reps bil; with contralateral hip abduction at step up Sidestepping with L4 band x 3 laps in // bars TRX squats 2x10 TRX lateral lunge x10 reps bil RDL 10# KB 2x10   Neuro Re-Ed Sidestepping on balance beam foam x 5 laps Tandem walking forward on foam balance beam x 5 laps     07/16/23 Therapeutic Exercise: Nu step 5 min L4 LE only during subjective 8 inch fwd step up in // bars x10 BIL no UE support 4 inch lateral step up in // bars x12 BIL no UE support Runner's step up onto airex in // bars x8 BIL no UE support CGA for safety 2 laps  tandem walking in // bars CGA first lap  STS x12 BW (cues for BOS), 5# 2x8 Standing marches w/ green band x8 BIL     07/13/23 Therapeutic Exercise: Nu step 5 min during subjective LE only  Sidelying abduction green band around knees, x12 BIL Green band bridge 2x12  6 inch step ups x12 in // bars BIL LE cues for pacing 4 inch lateral step ups x8 BIL LE cues for mechanics Tandem stance x61min BIL  Tandem stance airex 1x BIL to fatigue in // bars, CGA    07/10/23 TherEx  Nustep L5 x6 minutes BLEs for warmup Step ups 6 inch step no UE support X 10 bilat Tandem balance 30 sec X 2 bilat without UE support SLS balance 15 sec X 2 bilat without UE support Leg press DL 16# 1W96, then single leg 25# 2X10 bilat Bridges hold 5 sec 2X10 Hip flexor/quad stretch off edge of mat table 2x30 seconds B Sidelying hip ABD x10 B Seated HS stretches 2x30 seconds B   PATIENT EDUCATION:  Education details: rationale for interventions, HEP  Person educated: Patient Education method: Programmer, multimedia, Demonstration, and Handouts Education comprehension:  verbalized understanding, returned demonstration, and needs further education  HOME EXERCISE PROGRAM:  Access Code: [email or web address] URL: [email or web address] Date: 06/27/2023 Prepared by: Nedra Hai  Exercises - Supine Bridge with Resistance Band  - 2 x daily - 7 x weekly - 1 sets - 10 reps - 2 hold - Sidelying Hip Abduction with Resistance at Thighs  - 2 x daily - 7 x weekly - 1 sets - 10 reps - 1 hold - Standing Hamstring Curl with Resistance  - 2 x daily - 7 x weekly - 1 sets - 10 reps - 1 hold - Seated Hamstring Stretch  - 1-2 x daily - 7 x weekly - 3 reps - 30 seconds hold - Bridge Walk Out  - 1-2 x daily - 7 x weekly - 1 sets - 5-10 reps - Prone Hamstring Curl with Anchored Resistance  - 1-2 x daily - 7 x weekly - 1 sets - 10 reps - 1 second  hold - Quadruped Hip Abduction with Resistance Loop  - 1-2 x daily - 7 x weekly - 1 sets - 5-10 reps - 1 second  hold  ASSESSMENT:  CLINICAL IMPRESSION:  Session today focused on functional strengthening as well as some balance activities on compliant surface.  She tolerated session well without difficulty.  May benefit from aquatic therapy and discussed this with her today.  Will continue to benefit from PT to maximize function.  OBJECTIVE IMPAIRMENTS: decreased strength, increased edema, and pain.   ACTIVITY LIMITATIONS: sitting, standing, squatting, sleeping, stairs, transfers, and locomotion level  PARTICIPATION LIMITATIONS: driving, shopping, community activity, occupation, and yard work  PERSONAL FACTORS: Age, Behavior pattern, Fitness, Past/current experiences, Social background, and Time since onset of injury/illness/exacerbation are also affecting patient's functional outcome.   REHAB POTENTIAL: Excellent  CLINICAL DECISION MAKING: Stable/uncomplicated  EVALUATION COMPLEXITY: Low   GOALS: Goals reviewed with patient? Yes  SHORT TERM GOALS: Target date: 07/03/2023   Will be compliant with appropriate progressive  HEP  Baseline: Goal status: MET  06/29/23  2.  R knee pain to be no more than 1/10 at worst  Baseline:  Goal status:  ONGOING 06/29/23  3.  Will be compliant with appropriate edema management strategies (IE compression stocking, LE elevation, ice, etc) Baseline:  Goal status: ONGOING 7/12    LONG TERM GOALS: Target date: 07/24/2023  MMT to have improved by 1 grade in all weak groups  Baseline:  Goal status: INITIAL  2.  Will be able to ascend and descend stairs with no increase from resting pain R knee  Baseline:  Goal status: INITIAL  3.  Will be able to perform kneeling tasks and floor to stand with no increase from resting pain R knee  Baseline:  Goal status: INITIAL  4.  FOTO score to be within 5 points of goal  Baseline:  07/13/23: 64 (69 predicted) Goal status: MET     PLAN:  PT FREQUENCY: 1-2x/week  PT DURATION: 6 weeks  PLANNED INTERVENTIONS: Therapeutic exercises, Therapeutic activity, Patient/Family education, Self Care, Aquatic Therapy, Dry Needling, Taping, Ionotophoresis 4mg /ml Dexamethasone, Manual therapy, and Re-evaluation  PLAN FOR NEXT SESSION: will need d/c or recert next week; she's interested in a couple of aquatic sessions as well so continue discussion with her,  focus on functional strengthening and balance as appropriate.   NEXT MD VISIT: PRN  Clarita Crane, PT, DPT 07/19/23 9:16 AM

## 2023-07-23 ENCOUNTER — Encounter: Payer: Self-pay | Admitting: Physical Therapy

## 2023-07-23 ENCOUNTER — Ambulatory Visit: Payer: Medicare Other | Admitting: Physical Therapy

## 2023-07-23 DIAGNOSIS — R600 Localized edema: Secondary | ICD-10-CM

## 2023-07-23 DIAGNOSIS — M6281 Muscle weakness (generalized): Secondary | ICD-10-CM

## 2023-07-23 DIAGNOSIS — M25561 Pain in right knee: Secondary | ICD-10-CM | POA: Diagnosis not present

## 2023-07-23 NOTE — Therapy (Signed)
OUTPATIENT PHYSICAL THERAPY LOWER EXTREMITY TREATMENT/Discharge PHYSICAL THERAPY DISCHARGE SUMMARY  Visits from Start of Care: 9  Current functional level related to goals / functional outcomes: See below   Remaining deficits: See below   Education / Equipment: HEP  Plan:  Patient goals were met. Patient is being discharged due to meeting PT goals.     Patient Name: Sonia Adams MRN: [id number] DOB:1956/03/31, 67 y.o., female Today's Date: 07/23/2023  END OF SESSION:  PT End of Session - 07/23/23 0802     Visit Number 9    Number of Visits 13    Date for PT Re-Evaluation 07/24/23    Authorization Type UHC MCR    Authorization Time Period 06/12/23 to 07/24/23    Progress Note Due on Visit 10    PT Start Time 0802    PT Stop Time 0842    PT Time Calculation (min) 40 min    Activity Tolerance Patient tolerated treatment well;No increased pain    Behavior During Therapy WFL for tasks assessed/performed                   Past Medical History:  Diagnosis Date   Abnormal pap    HSV-2 infection    Menopause    Past Surgical History:  Procedure Laterality Date   BASAL CELL CARCINOMA EXCISION     upper lip   COLONOSCOPY WITH PROPOFOL N/A 04/27/2022   Procedure: COLONOSCOPY WITH PROPOFOL;  Surgeon: Lemar Lofty., MD;  Location: [facility] ENDOSCOPY;  Service: Gastroenterology;  Laterality: N/A;   ENDOSCOPIC MUCOSAL RESECTION N/A 04/27/2022   Procedure: ENDOSCOPIC MUCOSAL RESECTION;  Surgeon: Meridee Score Netty Starring., MD;  Location: [facility] ENDOSCOPY;  Service: Gastroenterology;  Laterality: N/A;   HEMOSTASIS CLIP PLACEMENT  04/27/2022   Procedure: HEMOSTASIS CLIP PLACEMENT;  Surgeon: Lemar Lofty., MD;  Location: [facility] ENDOSCOPY;  Service: Gastroenterology;;   LUMBAR FUSION  12/18/2004   POLYPECTOMY  04/27/2022   Procedure: POLYPECTOMY;  Surgeon: Lemar Lofty., MD;  Location: [facility] ENDOSCOPY;  Service: Gastroenterology;;   SUBMUCOSAL LIFTING INJECTION   04/27/2022   Procedure: SUBMUCOSAL LIFTING INJECTION;  Surgeon: Lemar Lofty., MD;  Location: [facility] ENDOSCOPY;  Service: Gastroenterology;;   TONSILLECTOMY  12/18/1960   TUBAL LIGATION  12/19/1987   Patient Active Problem List   Diagnosis Date Noted   Personal history of colonic polyps 07/08/2013   Nonspecific abnormal electrocardiogram (ECG) (EKG) 06/24/2013   Multinodular goiter 06/24/2013   Herniated cervical disc 06/24/2013   Former smoker  35 year  declines screening chest CT  cessation 2008 06/24/2013   Family history of breast cancer in first degree relative 06/24/2013   Abnormal EKG 06/24/2013   Asthmatic bronchitis 01/06/2013   Allergic rhinitis 08/01/2012   Dyspareunia 08/01/2012   Symptoms, such as flushing, sleeplessness, headache, lack of concentration, associated with the menopause 08/01/2012   Hyperlipidemia 08/01/2012   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/01/2012   Abnormal Pap smear of cervix 08/01/2012    PCP: Nadyne Coombes MD   REFERRING PROVIDER: Madelyn Brunner, DO  REFERRING DIAG:  Diagnosis  M25.561 (ICD-10-CM) - Acute pain of right knee    THERAPY DIAG:  Acute pain of right knee  Muscle weakness (generalized)  Localized edema  Rationale for Evaluation and Treatment: Rehabilitation  ONSET DATE: May 1st 2024  SUBJECTIVE:   SUBJECTIVE STATEMENT:  Reports her knee is doing good, she can do the stairs now without difficulty   PERTINENT HISTORY: Lumbar fusion 2006 PAIN:  Are you having pain?  Yes: NPRS scale: 0/10 Pain location: generalized muscle soreness  Pain description: soreness Aggravating factors: unsure  Relieving factors: unsure  Pain at worst 2/10 recently   PRECAUTIONS: None  WEIGHT BEARING RESTRICTIONS: No  FALLS:  Has patient fallen in last 6 months? Yes. Number of falls 1 that caused injury, (+) FOF "I do take tumbles, had a really bad one a couple of years ago in [place]"   LIVING ENVIRONMENT: Lives with: lives with  their spouse Lives in: House/apartment Stairs: 4 STE no rails but usually enters in basement and has full flight of steps to living room with rail Has following equipment at home: None  OCCUPATION: paralegal   PLOF: Independent, Independent with basic ADLs, Independent with gait, and Independent with transfers  PATIENT GOALS: get motion back in my knee, be able to get up and down (especially from low commode), be able to exercise pain free    OBJECTIVE: (objective measures completed at initial evaluation unless otherwise dated)   DIAGNOSTIC FINDINGS: 4 views of the right knee including AP standing, Rosenberg, lateral and  sunrise views were ordered and reviewed by myself.  X-rays demonstrate  moderate patellofemoral arthralgia, there is relatively well-preserved  tibiofemoral joint space without significant arthritic change.  There is  chondrocalcinosis noted throughout the lateral greater than medial joint  line.  No acute fracture noted.   Limited musculoskeletal ultrasound of the right lower extremity, right  knee was performed today.  Evaluation shows overlying quadricep tendon  without pathology.  There is a small to moderate effusion in the  suprapatellar pouch of the knee.  The medial joint line shows some  calcification, likely chondrocalcinosis over the medial joint.  No  cortical irregularity of the medial femoral condyle noted.  Patellar  tendon seen intact with proper attachment and insertion on the inferior  patella to the tibial tubercle without evidence of tearing.   Impression: Small to moderate joint effusion with chondrocalcinosis noted  within the joint   PATIENT SURVEYS:  EVAL: FOTO 57, predicted 69 in 12 visits  07/13/23 FOTO 64  07/23/23: FOTO 76%  COGNITION: Overall cognitive status: Within functional limits for tasks assessed     SENSATION: Not tested  EDEMA:  Visible edema noted  MUSCLE LENGTH: HS WNL B Piriformis WNL B    PALPATION: No  areas TTP around R knee, no muscle spasms noted   LOWER EXTREMITY ROM:  Active ROM Right eval Left eval  Knee flexion 2* 1*  Knee extension 134* 132*   (Blank rows = not tested)  LOWER EXTREMITY MMT:  MMT Right eval Left eval Right 06/29/23 Left 06/29/23 Right/Left 07/16/23  Right/left 07/23/23  Hip flexion 3 3 4 4  4/4 4+/4+  Hip extension 3 3 3 4     Hip abduction 3 3 4+ 4    Knee flexion 3+ 3+ 4- 4+ 5/5 5/5  Knee extension 4 4+ 4+ 4+ 5/5 5/5  Ankle dorsiflexion 5 5       (Blank rows = not tested)       TODAY'S TREATMENT:  07/23/23 Therapeutic Exercise: SciFit L4 x 5 min L4 LE only during subjective and FOTO updated Seated SLR 2X15 bilat Standing hip abd with green 2X15 bilat Standing hip abd with green 2X15 bilat Standing hip abd with green 2X15 bilat TRX squats 2x10 TRX lateral lunge x10 reps bil  Updated measurements and goals   Neuro Re-Ed Sidestepping on balance beam foam x 5 laps Tandem walking forward/backward on foam balance beam  x 5 laps SLS 15 sec X 3 bilat  07/19/23 Therapeutic Exercise: SciFit L4 x 5 min L4 LE only during subjective Forward step ups onto 6" step x 10 reps bil Lateral step ups onto 6" step x 10 reps bil; with contralateral hip abduction at step up Sidestepping with L4 band x 3 laps in // bars TRX squats 2x10 TRX lateral lunge x10 reps bil RDL 10# KB 2x10   Neuro Re-Ed Sidestepping on balance beam foam x 5 laps Tandem walking forward on foam balance beam x 5 laps   PATIENT EDUCATION:  Education details: rationale for interventions, HEP  Person educated: Patient Education method: Explanation, Demonstration, and Handouts Education comprehension: verbalized understanding, returned demonstration, and needs further education  HOME EXERCISE PROGRAM:  Access Code: [email or web address] URL: [email or web address] Date: 06/27/2023 Prepared by: Nedra Hai  Exercises - Supine Bridge with Resistance Band  - 2 x daily - 7 x  weekly - 1 sets - 10 reps - 2 hold - Sidelying Hip Abduction with Resistance at Thighs  - 2 x daily - 7 x weekly - 1 sets - 10 reps - 1 hold - Standing Hamstring Curl with Resistance  - 2 x daily - 7 x weekly - 1 sets - 10 reps - 1 hold - Seated Hamstring Stretch  - 1-2 x daily - 7 x weekly - 3 reps - 30 seconds hold - Bridge Walk Out  - 1-2 x daily - 7 x weekly - 1 sets - 5-10 reps - Prone Hamstring Curl with Anchored Resistance  - 1-2 x daily - 7 x weekly - 1 sets - 10 reps - 1 second  hold - Quadruped Hip Abduction with Resistance Loop  - 1-2 x daily - 7 x weekly - 1 sets - 5-10 reps - 1 second  hold  ASSESSMENT:  CLINICAL IMPRESSION:  She has now met all PT goals and feels ready to discharge to independent program. I did provide her with some additional strength exercises she can add into HEP as well and she showed good understanding of them.   OBJECTIVE IMPAIRMENTS: decreased strength, increased edema, and pain.   ACTIVITY LIMITATIONS: sitting, standing, squatting, sleeping, stairs, transfers, and locomotion level  PARTICIPATION LIMITATIONS: driving, shopping, community activity, occupation, and yard work  PERSONAL FACTORS: Age, Behavior pattern, Fitness, Past/current experiences, Social background, and Time since onset of injury/illness/exacerbation are also affecting patient's functional outcome.   REHAB POTENTIAL: Excellent  CLINICAL DECISION MAKING: Stable/uncomplicated  EVALUATION COMPLEXITY: Low   GOALS: Goals reviewed with patient? Yes  SHORT TERM GOALS: Target date: 07/03/2023   Will be compliant with appropriate progressive HEP  Baseline: Goal status: MET  06/29/23  2.  R knee pain to be no more than 1/10 at worst  Baseline:  Goal status:  ONGOING 06/29/23  3.  Will be compliant with appropriate edema management strategies (IE compression stocking, LE elevation, ice, etc) Baseline:  Goal status: ONGOING 7/12    LONG TERM GOALS: Target date: 07/24/2023    MMT  to have improved by 1 grade in all weak groups  Baseline:  Goal status: MET 07/23/23  2.  Will be able to ascend and descend stairs with no increase from resting pain R knee  Baseline:  Goal status: MET 07/23/23  3.  Will be able to perform kneeling tasks and floor to stand with no increase from resting pain R knee  Baseline:  Goal status: MET 07/23/23  4.  FOTO score to be within 5 points  of goal  Baseline:  Goal status: MET 07/23/23,  76 (69 predicted)     PLAN:  PT FREQUENCY: 1-2x/week  PT DURATION: 6 weeks  PLANNED INTERVENTIONS: Therapeutic exercises, Therapeutic activity, Patient/Family education, Self Care, Aquatic Therapy, Dry Needling, Taping, Ionotophoresis 4mg /ml Dexamethasone, Manual therapy, and Re-evaluation  PLAN FOR NEXT SESSION: DC today.   NEXT MD VISIT: PRN Ivery Quale, PT, DPT 07/23/23 8:03 AM

## 2023-07-25 ENCOUNTER — Encounter: Payer: Medicare Other | Admitting: Physical Therapy

## 2023-08-24 ENCOUNTER — Other Ambulatory Visit: Payer: Self-pay | Admitting: Family Medicine

## 2023-08-24 DIAGNOSIS — E2839 Other primary ovarian failure: Secondary | ICD-10-CM

## 2023-11-27 ENCOUNTER — Ambulatory Visit: Payer: Medicare Other | Admitting: Nurse Practitioner

## 2023-11-27 ENCOUNTER — Encounter: Payer: Self-pay | Admitting: Nurse Practitioner

## 2023-11-27 VITALS — BP 118/76 | HR 63 | Ht 67.0 in | Wt 156.5 lb

## 2023-11-27 DIAGNOSIS — K5909 Other constipation: Secondary | ICD-10-CM

## 2023-11-27 DIAGNOSIS — Z860101 Personal history of adenomatous and serrated colon polyps: Secondary | ICD-10-CM

## 2023-11-27 DIAGNOSIS — Z860100 Personal history of colon polyps, unspecified: Secondary | ICD-10-CM

## 2023-11-27 MED ORDER — NA SULFATE-K SULFATE-MG SULF 17.5-3.13-1.6 GM/177ML PO SOLN: 1.0000 | Freq: Once | ORAL | 0 refills | Status: AC

## 2023-11-27 NOTE — Patient Instructions (Addendum)
Increase water to 60 oz daily  Try metamucil 1-2 times daily  Can take Miralax 1 capful mixed in 8 ounces of water daily as needed  You have been scheduled for an endoscopy and colonoscopy. Please follow the written instructions given to you at your visit today.  Please pick up your prep supplies at the pharmacy within the next 1-3 days.  If you use inhalers (even only as needed), please bring them with you on the day of your procedure.  DO NOT TAKE 7 DAYS PRIOR TO TEST- Trulicity (dulaglutide) Ozempic, Wegovy (semaglutide) Mounjaro (tirzepatide) Bydureon Bcise (exanatide extended release)  DO NOT TAKE 1 DAY PRIOR TO YOUR TEST Rybelsus (semaglutide) Adlyxin (lixisenatide) Victoza (liraglutide) Byetta (exanatide) ____________________________________________________________________  _______________________________________________________  If your blood pressure at your visit was 140/90 or greater, please contact your primary care physician to follow up on this.  _______________________________________________________  If you are age 67 or older, your body mass index should be between 23-30. Your Body mass index is 24.51 kg/m. If this is out of the aforementioned range listed, please consider follow up with your Primary Care Provider.  If you are age 17 or younger, your body mass index should be between 19-25. Your Body mass index is 24.51 kg/m. If this is out of the aformentioned range listed, please consider follow up with your Primary Care Provider.   ________________________________________________________  The [facility] providers would like to encourage you to use [facility] to communicate with providers for non-urgent requests or questions.  Due to long hold times on the telephone, sending your provider a message by [facility] may be a faster and more efficient way to get a response.  Please allow 48 business hours for a response.  Please remember that this is for non-urgent  requests.  _______________________________________________________

## 2023-11-27 NOTE — Progress Notes (Signed)
ASSESSMENT    Brief Narrative:  67 y.o.  female known to Dr. Lavon Paganini with a past medical history not limited to chronic intermittent constipation described as infrequent urge.  Lately BMs have improved adding am oatmeal. She had a large polyp removed by Dr. Meridee Score in May 2023. Overdue for surveillance colonoscopy for which PCP has referred her back to [place].   History of large sessile serrated colon polyp, s/p EMR May 2023. Overdue for recommended 9-12 month follow up colonoscopy.   Chronic intermittent constipation, improved lately after adding oatmeal to diet.   PLAN   --Increase water to 60 oz daily  --Try metamucil 1-2 times daily  --Trial of Miralax 1 capful mixed in 8 ounces of water daily as needed --Schedule for a surveillance colonoscopy. The risks and benefits of colonoscopy with possible polypectomy / biopsies were discussed and the patient agrees to proceed.   HPI   Chief complaint : Time for another colonoscopy. Intermittent constipation  Brief GI History:  Patient evaluated here in March 2023 for a positive Cologuard.  She underwent colonoscopy with findings of a large ascending colon polyp.  Resection was not attempted due to the large size.  Subsequently had repeat colonoscopy with EMR of large polyp and removal of other smaller polyps.   Interval History:  Patient has a a history of chronic constipation, worse when travelling. She manages with high fiber diet. Takes Miralax as needed. Probably doesn't get adequate water intake   GI History / Pertinent GI Studies   **All endoscopic studies may not be included here    March 2023 colonoscopy for positive Cologuard -25 mm polyp in the ascending colon, tattooed but not removed.  Sigmoid diverticulosis.  3 mm sigmoid polyp removed.  Nonbleeding external and internal hemorrhoids Path - hyperplastic polyps  May 2023 Repeat colonoscopy by Dr. Meridee Score -35 mm polyp found in the proximal ascending colon.   Piecemeal mucosal resection was performed.  2 small sessile rectal polyps were removed.   A.   COLON, ASCENDING, EMR:  -    Sessile serrated polyp, negative for dysplasia.   B.   RECTUM, POLYPECTOMY:  -    Serrated mucosal polyp with mild crypt growth abnormality,  indefinite for sessile serrated polyp, negative for dysplasia.   Repeat colonoscopy recommended in 9-12 months      Latest Ref Rng & Units 06/29/2014   10:52 AM 06/24/2013    9:29 AM  Hepatic Function  Total Protein 6.0 - 8.3 g/dL 6.2  7.3   Albumin 3.5 - 5.2 g/dL 4.2  4.6   AST 0 - 37 U/L 23  21   ALT 0 - 35 U/L 15  15   Alk Phosphatase 39 - 117 U/L 61  65   Total Bilirubin 0.2 - 1.2 mg/dL 0.5  0.5        Latest Ref Rng & Units 06/29/2014   10:52 AM 06/24/2013    9:29 AM  CBC  WBC 4.0 - 10.5 K/uL 6.6  7.0   Hemoglobin 12.0 - 15.0 g/dL 40.3  47.4   Hematocrit 36.0 - 46.0 % 40.6  42.5   Platelets 150 - 400 K/uL 260  253      Past Medical History:  Diagnosis Date   Abnormal pap    HSV-2 infection    Menopause     Past Surgical History:  Procedure Laterality Date   BASAL CELL CARCINOMA EXCISION     upper lip   COLONOSCOPY WITH  PROPOFOL N/A 04/27/2022   Procedure: COLONOSCOPY WITH PROPOFOL;  Surgeon: Meridee Score Netty Starring., MD;  Location: [facility] ENDOSCOPY;  Service: Gastroenterology;  Laterality: N/A;   ENDOSCOPIC MUCOSAL RESECTION N/A 04/27/2022   Procedure: ENDOSCOPIC MUCOSAL RESECTION;  Surgeon: Meridee Score Netty Starring., MD;  Location: [facility] ENDOSCOPY;  Service: Gastroenterology;  Laterality: N/A;   HEMOSTASIS CLIP PLACEMENT  04/27/2022   Procedure: HEMOSTASIS CLIP PLACEMENT;  Surgeon: Lemar Lofty., MD;  Location: [facility] ENDOSCOPY;  Service: Gastroenterology;;   LUMBAR FUSION  12/18/2004   POLYPECTOMY  04/27/2022   Procedure: POLYPECTOMY;  Surgeon: Lemar Lofty., MD;  Location: [facility] ENDOSCOPY;  Service: Gastroenterology;;   SUBMUCOSAL LIFTING INJECTION  04/27/2022   Procedure: SUBMUCOSAL LIFTING  INJECTION;  Surgeon: Lemar Lofty., MD;  Location: [facility] ENDOSCOPY;  Service: Gastroenterology;;   TONSILLECTOMY  12/18/1960   TUBAL LIGATION  12/19/1987    Family History  Problem Relation Age of Onset   Diabetes Mother    Alzheimer's disease Mother    Stroke Father    Heart disease Father    Colon cancer Neg Hx    Colon polyps Neg Hx    Esophageal cancer Neg Hx    Stomach cancer Neg Hx    Rectal cancer Neg Hx     Current Medications, Allergies, Family History and Social History were reviewed in [employer] electronic medical record.     Current Outpatient Medications  Medication Sig Dispense Refill   Poison Ivy Treatments (IVAREST MEDICATED POISON IVY EX) Apply 1 application. topically daily.     Vitamin D, Ergocalciferol, (DRISDOL) 1.25 MG (50000 UNIT) CAPS capsule Take 50,000 Units by mouth once a week.     No current facility-administered medications for this visit.    Review of Systems: No chest pain. No shortness of breath. No urinary complaints.    Physical Exam  There were no vitals filed for this visit. Wt Readings from Last 3 Encounters:  04/27/22 152 lb (68.9 kg)  02/22/22 152 lb (68.9 kg)  02/08/22 152 lb (68.9 kg)    LMP 01/01/2013  Constitutional:  Pleasant, generally well appearing female in no acute distress. Psychiatric: Normal mood and affect. Behavior is normal. EENT: Pupils normal.  Conjunctivae are normal. No scleral icterus. Neck supple.  Cardiovascular: Normal rate, regular rhythm.  Pulmonary/chest: Effort normal and breath sounds normal. No wheezing, rales or rhonchi. Abdominal: Soft, nondistended, nontender. Bowel sounds active throughout. There are no masses palpable. No hepatomegaly. Neurological: Alert and oriented to person place and time.    Willette Cluster, NP  11/27/2023, 8:42 AM  Cc:  Dois Davenport, MD

## 2024-01-10 ENCOUNTER — Encounter: Payer: Self-pay | Admitting: Gastroenterology

## 2024-01-18 ENCOUNTER — Ambulatory Visit (AMBULATORY_SURGERY_CENTER): Payer: Medicare Other | Admitting: Gastroenterology

## 2024-01-18 ENCOUNTER — Encounter: Payer: Self-pay | Admitting: Gastroenterology

## 2024-01-18 VITALS — BP 112/56 | HR 54 | Temp 98.2°F | Resp 13 | Ht 67.0 in | Wt 156.0 lb

## 2024-01-18 DIAGNOSIS — K648 Other hemorrhoids: Secondary | ICD-10-CM

## 2024-01-18 DIAGNOSIS — Z1211 Encounter for screening for malignant neoplasm of colon: Secondary | ICD-10-CM | POA: Diagnosis not present

## 2024-01-18 DIAGNOSIS — Z860100 Personal history of colon polyps, unspecified: Secondary | ICD-10-CM

## 2024-01-18 DIAGNOSIS — K635 Polyp of colon: Secondary | ICD-10-CM

## 2024-01-18 DIAGNOSIS — D120 Benign neoplasm of cecum: Secondary | ICD-10-CM

## 2024-01-18 DIAGNOSIS — K644 Residual hemorrhoidal skin tags: Secondary | ICD-10-CM | POA: Diagnosis not present

## 2024-01-18 MED ORDER — SODIUM CHLORIDE 0.9 % IV SOLN: 500.0000 mL | Freq: Once | INTRAVENOUS | Status: DC

## 2024-01-18 NOTE — Progress Notes (Signed)
 Called to room to assist during endoscopic procedure.  Patient ID and intended procedure confirmed with present staff. Received instructions for my participation in the procedure from the performing physician.

## 2024-01-18 NOTE — Op Note (Signed)
[facility] Patient Name: Sonia Adams Procedure Date: 01/18/2024 1:28 PM MRN: [id number] Endoscopist: Napoleon Form , MD, [id number] Age: 68 Referring MD:  Date of Birth: September 27, 1956 Gender: Female Account #: [id number] Procedure:                Colonoscopy Indications:              High risk colon cancer surveillance: Personal                            history of adenoma (10 mm or greater in size), High                            risk colon cancer surveillance: Personal history of                            sessile serrated colon polyp (10 mm or greater in                            size) Medicines:                Monitored Anesthesia Care Procedure:                Pre-Anesthesia Assessment:                           - Prior to the procedure, a History and Physical                            was performed, and patient medications and                            allergies were reviewed. The patient's tolerance of                            previous anesthesia was also reviewed. The risks                            and benefits of the procedure and the sedation                            options and risks were discussed with the patient.                            All questions were answered, and informed consent                            was obtained. Prior Anticoagulants: The patient has                            taken no anticoagulant or antiplatelet agents. ASA                            Grade Assessment: II - A patient with mild systemic  disease. After reviewing the risks and benefits,                            the patient was deemed in satisfactory condition to                            undergo the procedure.                           After obtaining informed consent, the colonoscope                            was passed under direct vision. Throughout the                            procedure, the patient's blood pressure, pulse,  and                            oxygen saturations were monitored continuously. The                            Olympus Scope SN: [id number] was introduced through                            the anus and advanced to the the cecum, identified                            by appendiceal orifice and ileocecal valve. The                            colonoscopy was performed without difficulty. The                            patient tolerated the procedure well. The quality                            of the bowel preparation was good. The ileocecal                            valve, appendiceal orifice, and rectum were                            photographed. Scope In: 1:35:49 PM Scope Out: 1:59:54 PM Scope Withdrawal Time: 0 hours 14 minutes 47 seconds  Total Procedure Duration: 0 hours 24 minutes 5 seconds  Findings:                 The perianal and digital rectal examinations were                            normal.                           A 20 mm polyp was found in the cecum. The polyp was  granular lateral spreading. The polyp was removed                            with a piecemeal technique using a cold snare.                            Resection and retrieval were complete.                           Non-bleeding external and internal hemorrhoids were                            found during retroflexion. The hemorrhoids were                            medium-sized. Complications:            No immediate complications. Estimated Blood Loss:     Estimated blood loss was minimal. Impression:               - One 20 mm polyp in the cecum, removed piecemeal                            using a cold snare. Resected and retrieved.                           - Non-bleeding external and internal hemorrhoids. Recommendation:           - Patient has a contact number available for                            emergencies. The signs and symptoms of potential                             delayed complications were discussed with the                            patient. Return to normal activities tomorrow.                            Written discharge instructions were provided to the                            patient.                           - Resume previous diet.                           - Continue present medications.                           - Await pathology results.                           - Repeat colonoscopy in 1 year for surveillance  after piecemeal polypectomy and for surveillance                            based on pathology results. Napoleon Form, MD 01/18/2024 2:07:21 PM This report has been signed electronically.

## 2024-01-18 NOTE — Progress Notes (Signed)
[facility] History and Physical   Primary Care Physician:  Dois Davenport, MD   Reason for Procedure:  History of adenomatous colon polyps  Plan:    Surveillance colonoscopy with possible interventions as needed     HPI: Sonia Adams is a very pleasant 68 y.o. female here for surveillance colonoscopy. Denies any nausea, vomiting, abdominal pain, melena or bright red blood per rectum  The risks and benefits as well as alternatives of endoscopic procedure(s) have been discussed and reviewed. All questions answered. The patient agrees to proceed.    Past Medical History:  Diagnosis Date   Abnormal pap    HSV-2 infection    Menopause     Past Surgical History:  Procedure Laterality Date   BASAL CELL CARCINOMA EXCISION     upper lip   COLONOSCOPY WITH PROPOFOL N/A 04/27/2022   Procedure: COLONOSCOPY WITH PROPOFOL;  Surgeon: Meridee Score Netty Starring., MD;  Location: [facility] ENDOSCOPY;  Service: Gastroenterology;  Laterality: N/A;   ENDOSCOPIC MUCOSAL RESECTION N/A 04/27/2022   Procedure: ENDOSCOPIC MUCOSAL RESECTION;  Surgeon: Meridee Score Netty Starring., MD;  Location: [facility] ENDOSCOPY;  Service: Gastroenterology;  Laterality: N/A;   HEMOSTASIS CLIP PLACEMENT  04/27/2022   Procedure: HEMOSTASIS CLIP PLACEMENT;  Surgeon: Lemar Lofty., MD;  Location: [facility] ENDOSCOPY;  Service: Gastroenterology;;   LUMBAR FUSION  12/18/2004   POLYPECTOMY  04/27/2022   Procedure: POLYPECTOMY;  Surgeon: Lemar Lofty., MD;  Location: [facility] ENDOSCOPY;  Service: Gastroenterology;;   SUBMUCOSAL LIFTING INJECTION  04/27/2022   Procedure: SUBMUCOSAL LIFTING INJECTION;  Surgeon: Lemar Lofty., MD;  Location: [facility] ENDOSCOPY;  Service: Gastroenterology;;   TONSILLECTOMY  12/18/1960   TUBAL LIGATION  12/19/1987    Prior to Admission medications   Medication Sig Start Date End Date Taking? Authorizing Provider  ergocalciferol (VITAMIN D2) 1.25 MG (50000 UT) capsule Take 1 capsule by  mouth once a week. 11/30/21   [provider]  estradiol (ESTRACE) 0.1 MG/GM vaginal cream 1 Applicatorful once a week. Twice weekly 10/07/23   [provider]  Multiple Vitamins-Minerals (CENTRUM MINIS WOMEN 50+) TABS Take 1 tablet by mouth daily. 06/04/23   [provider]    Current Outpatient Medications  Medication Sig Dispense Refill   ergocalciferol (VITAMIN D2) 1.25 MG (50000 UT) capsule Take 1 capsule by mouth once a week.     estradiol (ESTRACE) 0.1 MG/GM vaginal cream 1 Applicatorful once a week. Twice weekly     Multiple Vitamins-Minerals (CENTRUM MINIS WOMEN 50+) TABS Take 1 tablet by mouth daily.     No current facility-administered medications for this visit.    Allergies as of 01/18/2024 - Review Complete 11/27/2023  Allergen Reaction Noted   Levaquin [levofloxacin] Hives 04/12/2021   Bacitracin Rash 11/27/2023    Family History  Problem Relation Age of Onset   Diabetes Mother    Alzheimer's disease Mother    Stroke Father    Heart disease Father    Colon cancer Neg Hx    Colon polyps Neg Hx    Esophageal cancer Neg Hx    Stomach cancer Neg Hx    Rectal cancer Neg Hx     Social History   Socioeconomic History   Marital status: Married    Spouse name: Not on file   Number of children: Not on file   Years of education: Not on file   Highest education level: Not on file  Occupational History   Not on file  Tobacco Use   Smoking status:  Former    Current packs/day: 0.00    Average packs/day: 1 pack/day for 20.0 years (20.0 ttl pk-yrs)    Types: Cigarettes    Start date: 06/01/1987    Quit date: 06/01/2007    Years since quitting: 16.6   Smokeless tobacco: Never  Vaping Use   Vaping status: Never Used  Substance and Sexual Activity   Alcohol use: Yes    Comment: once a month   Drug use: No   Sexual activity: Yes    Partners: Male    Birth control/protection: Post-menopausal  Other Topics Concern   Not on file  Social  History Narrative   Not on file   Social Drivers of Health   Financial Resource Strain: Not on file  Food Insecurity: Not on file  Transportation Needs: Not on file  Physical Activity: Not on file  Stress: Not on file  Social Connections: Not on file  Intimate Partner Violence: Not on file    Review of Systems:  All other review of systems negative except as mentioned in the HPI.  Physical Exam: Vital signs in last 24 hours: LMP 01/01/2013  General:   Alert, NAD Lungs:  Clear .   Heart:  Regular rate and rhythm Abdomen:  Soft, nontender and nondistended. Neuro/Psych:  Alert and cooperative. Normal mood and affect. A and O x 3  Reviewed labs, radiology imaging, old records and pertinent past GI work up  Patient is appropriate for planned procedure(s) and anesthesia in an ambulatory setting   K. Scherry Ran , MD [phone]

## 2024-01-18 NOTE — Patient Instructions (Addendum)
Resume previous diet and medications.  Handout provided on polyps and hemorrhoids.  Follow up colonoscopy in one year based on Dr. Elana Alm recommendations.    YOU HAD AN ENDOSCOPIC PROCEDURE TODAY AT THE [facility]:   Refer to the procedure report that was given to you for any specific questions about what was found during the examination.  If the procedure report does not answer your questions, please call your gastroenterologist to clarify.  If you requested that your care partner not be given the details of your procedure findings, then the procedure report has been included in a sealed envelope for you to review at your convenience later.  YOU SHOULD EXPECT: Some feelings of bloating in the abdomen. Passage of more gas than usual.  Walking can help get rid of the air that was put into your GI tract during the procedure and reduce the bloating. If you had a lower endoscopy (such as a colonoscopy or flexible sigmoidoscopy) you may notice spotting of blood in your stool or on the toilet paper. If you underwent a bowel prep for your procedure, you may not have a normal bowel movement for a few days.  Please Note:  You might notice some irritation and congestion in your nose or some drainage.  This is from the oxygen used during your procedure.  There is no need for concern and it should clear up in a day or so.  SYMPTOMS TO REPORT IMMEDIATELY:  Following lower endoscopy (colonoscopy or flexible sigmoidoscopy):  Excessive amounts of blood in the stool  Significant tenderness or worsening of abdominal pains  Swelling of the abdomen that is new, acute  Fever of 100F or higher  For urgent or emergent issues, a gastroenterologist can be reached at any hour by calling [phone]. Do not use MyChart messaging for urgent concerns.    DIET:  We do recommend a small meal at first, but then you may proceed to your regular diet.  Drink plenty of fluids but you should avoid alcoholic  beverages for 24 hours.  ACTIVITY:  You should plan to take it easy for the rest of today and you should NOT DRIVE or use heavy machinery until tomorrow (because of the sedation medicines used during the test).    FOLLOW UP: Our staff will call the number listed on your records the next business day following your procedure.  We will call around 7:15- 8:00 am to check on you and address any questions or concerns that you may have regarding the information given to you following your procedure. If we do not reach you, we will leave a message.     If any biopsies were taken you will be contacted by phone or by letter within the next 1-3 weeks.  Please call us at [phone] if you have not heard about the biopsies in 3 weeks.    SIGNATURES/CONFIDENTIALITY: You and/or your care partner have signed paperwork which will be entered into your electronic medical record.  These signatures attest to the fact that that the information above on your After Visit Summary has been reviewed and is understood.  Full responsibility of the confidentiality of this discharge information lies with you and/or your care-partner.

## 2024-01-18 NOTE — Progress Notes (Signed)
 To pacu, VSS. Report to [email or web address]

## 2024-01-21 ENCOUNTER — Telehealth: Payer: Self-pay

## 2024-01-21 NOTE — Telephone Encounter (Signed)
Follow up call to pt, no answer. 

## 2024-01-21 NOTE — Telephone Encounter (Signed)
Inbound call from patient, states she has no concerns at this time and is doing well after her procedure.

## 2024-01-23 LAB — SURGICAL PATHOLOGY

## 2024-02-26 ENCOUNTER — Encounter: Payer: Self-pay | Admitting: Gastroenterology

## 2024-04-21 ENCOUNTER — Encounter: Payer: Self-pay | Admitting: Family Medicine

## 2024-04-22 ENCOUNTER — Other Ambulatory Visit: Payer: Self-pay | Admitting: Family Medicine

## 2024-04-22 DIAGNOSIS — J449 Chronic obstructive pulmonary disease, unspecified: Secondary | ICD-10-CM

## 2024-04-28 ENCOUNTER — Other Ambulatory Visit: Payer: Self-pay | Admitting: Family Medicine

## 2024-04-28 DIAGNOSIS — R918 Other nonspecific abnormal finding of lung field: Secondary | ICD-10-CM

## 2024-05-08 ENCOUNTER — Ambulatory Visit
Admission: RE | Admit: 2024-05-08 | Discharge: 2024-05-08 | Disposition: A | Discharge status: Home / Self Care | Payer: Medicare Other | Source: Ambulatory Visit | Attending: Family Medicine | Admitting: Family Medicine

## 2024-05-08 ENCOUNTER — Other Ambulatory Visit

## 2024-05-08 DIAGNOSIS — E2839 Other primary ovarian failure: Secondary | ICD-10-CM

## 2024-05-14 ENCOUNTER — Ambulatory Visit
Admission: RE | Admit: 2024-05-14 | Discharge: 2024-05-14 | Disposition: A | Discharge status: Home / Self Care | Source: Ambulatory Visit | Attending: Family Medicine | Admitting: Family Medicine

## 2024-05-14 DIAGNOSIS — R918 Other nonspecific abnormal finding of lung field: Secondary | ICD-10-CM

## 2024-06-16 ENCOUNTER — Encounter: Payer: Self-pay | Admitting: Family Medicine

## 2024-12-19 ENCOUNTER — Other Ambulatory Visit

## 2024-12-19 ENCOUNTER — Encounter: Payer: Self-pay | Admitting: Sports Medicine

## 2024-12-19 ENCOUNTER — Ambulatory Visit: Admitting: Sports Medicine

## 2024-12-19 DIAGNOSIS — G8929 Other chronic pain: Secondary | ICD-10-CM

## 2024-12-19 DIAGNOSIS — M79644 Pain in right finger(s): Secondary | ICD-10-CM

## 2024-12-19 DIAGNOSIS — M1811 Unilateral primary osteoarthritis of first carpometacarpal joint, right hand: Secondary | ICD-10-CM | POA: Diagnosis not present

## 2024-12-19 NOTE — Progress Notes (Signed)
 Patient says that she has had right thumb pain for a couple of months now. Patient denies any known injury or change in activity at that time. She points over the [facility] joint when describing her pain. Opposition is painful, as are motions where she needs to grip with the right hand. She has tried various topical treatments, as well as a compression sleeve and ice, with no relief. She is right-hand dominant.

## 2024-12-19 NOTE — Progress Notes (Signed)
 "  Sonia Adams - 69 y.o. female MRN [id number]  Date of birth: 1956-11-27  Office Visit Note: Visit Date: 12/19/2024 PCP: Burney Darice CROME, MD Referred by: Burney Darice CROME, MD  Subjective: Chief Complaint  Patient presents with   Right Hand - Pain   HPI: Sonia Adams is a pleasant 69 y.o. female who presents today for chronic right thumb pain.  Lafaye states she has had right thumb pain for a couple of months now. Patient denies any known injury or change in activity at that time. She points over the [facility] joint when describing her pain. Opposition is painful, as are motions where she needs to grip with the right hand. She has tried various topical treatments, as well as a compression sleeve and ice, with no relief. She is right-hand dominant.   Pertinent ROS were reviewed with the patient and found to be negative unless otherwise specified above in HPI.   Assessment & Plan: Visit Diagnoses:  1. Chronic pain of right thumb   2. Primary osteoarthritis of first carpometacarpal joint of right hand    Plan: Impression is right thumb pain with moderate CMC osteoarthritic change.  Had a flare recently that has slowly began improving with conservative treatment.  Recommending alternating Aleve and Tylenol for up to a week and then discontinue. Recommend Voltaren gel TID. May use ice or heat/Epsom salt warm bath for the thumb.  We did discuss bracing for the thumb as well.  Discussed other options such as intra-articular injection, but both Marci and I are agreeable to hold off given her pain is not severe at this time.  She will follow-up with me as needed.  Follow-up: Return if symptoms worsen or fail to improve.   Meds & Orders: No orders of the defined types were placed in this encounter.   Orders Placed This Encounter  Procedures   XR Finger Thumb Right     Procedures: No procedures performed      Clinical History: No specialty comments available.  She reports that she quit  smoking about 17 years ago. Her smoking use included cigarettes. She started smoking about 37 years ago. She has a 20 pack-year smoking history. She has never used smokeless tobacco. No results for input(s): HGBA1C, LABURIC in the last 8760 hours.  Objective:   Vital Signs: LMP 01/01/2013   Physical Exam  Gen: Well-appearing, in no acute distress; non-toxic CV: Well-perfused. Warm.  Resp: Breathing unlabored on room air; no wheezing. Psych: Fluid speech in conversation; appropriate affect; normal thought process  Ortho Exam - Right thumb: + Mild TTP with CMC grind test and palpation over the base of the thumb, pain with opposition but full strength intact of APL and EPB.  Negative Finkelstein's test.  Imaging: XR Finger Thumb Right Result Date: 12/19/2024 4 view x-ray of the right thumb including AP, lateral, oblique and Henry view were ordered and reviewed by myself today.  X-rays demonstrate moderate narrowing at the [facility] joint with osteoarthritic change and subchondral change and osteophytic spurring.  No acute fracture noted.   Past Medical/Family/Surgical/Social History: Medications & Allergies reviewed per EMR, new medications updated. Patient Active Problem List   Diagnosis Date Noted   History of colonic polyps 07/08/2013   Nonspecific abnormal electrocardiogram (ECG) (EKG) 06/24/2013   Multinodular goiter 06/24/2013   Herniated cervical disc 06/24/2013   Former smoker  35 year  declines screening chest CT  cessation 2008 06/24/2013   Family history of breast cancer in first  degree relative 06/24/2013   Abnormal EKG 06/24/2013   Asthmatic bronchitis 01/06/2013   Allergic rhinitis 08/01/2012   Dyspareunia 08/01/2012   Symptoms, such as flushing, sleeplessness, headache, lack of concentration, associated with the menopause 08/01/2012   Hyperlipidemia 08/01/2012   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/01/2012   Abnormal Pap smear of cervix 08/01/2012   Past Medical History:   Diagnosis Date   Abnormal pap    HSV-2 infection    Menopause    Family History  Problem Relation Age of Onset   Diabetes Mother    Alzheimer's disease Mother    Stroke Father    Heart disease Father    Colon cancer Neg Hx    Colon polyps Neg Hx    Esophageal cancer Neg Hx    Stomach cancer Neg Hx    Rectal cancer Neg Hx    Past Surgical History:  Procedure Laterality Date   BASAL CELL CARCINOMA EXCISION     upper lip   COLONOSCOPY WITH PROPOFOL  N/A 04/27/2022   Procedure: COLONOSCOPY WITH PROPOFOL ;  Surgeon: Wilhelmenia Aloha Raddle., MD;  Location: [facility] ENDOSCOPY;  Service: Gastroenterology;  Laterality: N/A;   ENDOSCOPIC MUCOSAL RESECTION N/A 04/27/2022   Procedure: ENDOSCOPIC MUCOSAL RESECTION;  Surgeon: Wilhelmenia Aloha Raddle., MD;  Location: [facility] ENDOSCOPY;  Service: Gastroenterology;  Laterality: N/A;   HEMOSTASIS CLIP PLACEMENT  04/27/2022   Procedure: HEMOSTASIS CLIP PLACEMENT;  Surgeon: Wilhelmenia Aloha Raddle., MD;  Location: [facility] ENDOSCOPY;  Service: Gastroenterology;;   LUMBAR FUSION  12/18/2004   POLYPECTOMY  04/27/2022   Procedure: POLYPECTOMY;  Surgeon: Wilhelmenia Aloha Raddle., MD;  Location: [facility] ENDOSCOPY;  Service: Gastroenterology;;   SUBMUCOSAL LIFTING INJECTION  04/27/2022   Procedure: SUBMUCOSAL LIFTING INJECTION;  Surgeon: Wilhelmenia Aloha Raddle., MD;  Location: [facility] ENDOSCOPY;  Service: Gastroenterology;;   TONSILLECTOMY  12/18/1960   TUBAL LIGATION  12/19/1987   Social History   Occupational History   Not on file  Tobacco Use   Smoking status: Former    Current packs/day: 0.00    Average packs/day: 1 pack/day for 20.0 years (20.0 ttl pk-yrs)    Types: Cigarettes    Start date: 06/01/1987    Quit date: 06/01/2007    Years since quitting: 17.5   Smokeless tobacco: Never  Vaping Use   Vaping status: Never Used  Substance and Sexual Activity   Alcohol use: Yes    Comment: once a month   Drug use: No   Sexual activity: Yes    Partners: Male    Birth  control/protection: Post-menopausal   "

## 2025-01-15 ENCOUNTER — Encounter: Payer: Self-pay | Admitting: Gastroenterology

## 2025-02-11 ENCOUNTER — Encounter

## 2025-02-25 ENCOUNTER — Encounter: Admitting: Gastroenterology
